# Patient Record
Sex: Male | Born: 1950 | Race: Black or African American | Hispanic: No | Marital: Single | Smoking: Heavy tobacco smoker
Health system: Southern US, Community
[De-identification: ages and names within clinical notes are randomized; demographics above are authoritative.]

## PROBLEM LIST (undated history)

## (undated) DIAGNOSIS — F209 Schizophrenia, unspecified: Secondary | ICD-10-CM

## (undated) DIAGNOSIS — R569 Unspecified convulsions: Secondary | ICD-10-CM

## (undated) DIAGNOSIS — E785 Hyperlipidemia, unspecified: Secondary | ICD-10-CM

## (undated) DIAGNOSIS — I10 Essential (primary) hypertension: Secondary | ICD-10-CM

## (undated) DIAGNOSIS — F419 Anxiety disorder, unspecified: Secondary | ICD-10-CM

---

## 2015-12-20 ENCOUNTER — Encounter (HOSPITAL_COMMUNITY): Payer: Self-pay | Admitting: Emergency Medicine

## 2015-12-20 ENCOUNTER — Emergency Department (HOSPITAL_COMMUNITY): Payer: Medicare Other

## 2015-12-20 ENCOUNTER — Inpatient Hospital Stay (HOSPITAL_COMMUNITY)
Admission: EM | Admit: 2015-12-20 | Discharge: 2016-01-03 | DRG: 163 | Disposition: A | Discharge status: Home / Self Care | Payer: Medicare Other | Attending: Internal Medicine | Admitting: Internal Medicine

## 2015-12-20 DIAGNOSIS — I10 Essential (primary) hypertension: Secondary | ICD-10-CM | POA: Diagnosis present

## 2015-12-20 DIAGNOSIS — Z9689 Presence of other specified functional implants: Secondary | ICD-10-CM

## 2015-12-20 DIAGNOSIS — J918 Pleural effusion in other conditions classified elsewhere: Secondary | ICD-10-CM | POA: Diagnosis present

## 2015-12-20 DIAGNOSIS — Z01818 Encounter for other preprocedural examination: Secondary | ICD-10-CM

## 2015-12-20 DIAGNOSIS — N183 Chronic kidney disease, stage 3 (moderate): Secondary | ICD-10-CM | POA: Diagnosis present

## 2015-12-20 DIAGNOSIS — I129 Hypertensive chronic kidney disease with stage 1 through stage 4 chronic kidney disease, or unspecified chronic kidney disease: Secondary | ICD-10-CM | POA: Diagnosis present

## 2015-12-20 DIAGNOSIS — F209 Schizophrenia, unspecified: Secondary | ICD-10-CM | POA: Diagnosis present

## 2015-12-20 DIAGNOSIS — J189 Pneumonia, unspecified organism: Secondary | ICD-10-CM | POA: Diagnosis not present

## 2015-12-20 DIAGNOSIS — F17210 Nicotine dependence, cigarettes, uncomplicated: Secondary | ICD-10-CM | POA: Diagnosis present

## 2015-12-20 DIAGNOSIS — J869 Pyothorax without fistula: Secondary | ICD-10-CM | POA: Diagnosis present

## 2015-12-20 DIAGNOSIS — Z7982 Long term (current) use of aspirin: Secondary | ICD-10-CM

## 2015-12-20 DIAGNOSIS — N179 Acute kidney failure, unspecified: Secondary | ICD-10-CM | POA: Insufficient documentation

## 2015-12-20 DIAGNOSIS — F419 Anxiety disorder, unspecified: Secondary | ICD-10-CM | POA: Diagnosis present

## 2015-12-20 DIAGNOSIS — Z09 Encounter for follow-up examination after completed treatment for conditions other than malignant neoplasm: Secondary | ICD-10-CM

## 2015-12-20 DIAGNOSIS — Z833 Family history of diabetes mellitus: Secondary | ICD-10-CM

## 2015-12-20 DIAGNOSIS — D638 Anemia in other chronic diseases classified elsewhere: Secondary | ICD-10-CM | POA: Diagnosis present

## 2015-12-20 DIAGNOSIS — J9811 Atelectasis: Secondary | ICD-10-CM | POA: Diagnosis present

## 2015-12-20 DIAGNOSIS — Z79899 Other long term (current) drug therapy: Secondary | ICD-10-CM

## 2015-12-20 DIAGNOSIS — E785 Hyperlipidemia, unspecified: Secondary | ICD-10-CM | POA: Diagnosis present

## 2015-12-20 DIAGNOSIS — J90 Pleural effusion, not elsewhere classified: Secondary | ICD-10-CM | POA: Insufficient documentation

## 2015-12-20 HISTORY — DX: Unspecified convulsions: R56.9

## 2015-12-20 HISTORY — DX: Schizophrenia, unspecified: F20.9

## 2015-12-20 HISTORY — DX: Essential (primary) hypertension: I10

## 2015-12-20 HISTORY — DX: Hyperlipidemia, unspecified: E78.5

## 2015-12-20 HISTORY — DX: Anxiety disorder, unspecified: F41.9

## 2015-12-20 LAB — CBC WITH DIFFERENTIAL/PLATELET
Basophils Absolute: 0 10*3/uL (ref 0.0–0.1)
Basophils Relative: 0 %
Eosinophils Absolute: 0 10*3/uL (ref 0.0–0.7)
Eosinophils Relative: 0 %
HCT: 38.8 % — ABNORMAL LOW (ref 39.0–52.0)
Hemoglobin: 12.7 g/dL — ABNORMAL LOW (ref 13.0–17.0)
Lymphocytes Relative: 9 %
Lymphs Abs: 1.6 10*3/uL (ref 0.7–4.0)
MCH: 29.2 pg (ref 26.0–34.0)
MCHC: 32.7 g/dL (ref 30.0–36.0)
MCV: 89.2 fL (ref 78.0–100.0)
Monocytes Absolute: 1.3 10*3/uL — ABNORMAL HIGH (ref 0.1–1.0)
Monocytes Relative: 7 %
Neutro Abs: 15.9 10*3/uL — ABNORMAL HIGH (ref 1.7–7.7)
Neutrophils Relative %: 84 %
Platelets: 277 10*3/uL (ref 150–400)
RBC: 4.35 MIL/uL (ref 4.22–5.81)
RDW: 12.9 % (ref 11.5–15.5)
WBC: 18.9 10*3/uL — ABNORMAL HIGH (ref 4.0–10.5)

## 2015-12-20 LAB — I-STAT CG4 LACTIC ACID, ED: Lactic Acid, Venous: 1.59 mmol/L (ref 0.5–2.0)

## 2015-12-20 MED ORDER — ACETAMINOPHEN 325 MG PO TABS
650.0000 mg | ORAL_TABLET | Freq: Once | ORAL | Status: AC | PRN
  Administered 2015-12-20: 650 mg via ORAL
  Filled 2015-12-20: qty 2

## 2015-12-20 MED ORDER — SODIUM CHLORIDE 0.9 % IV BOLUS (SEPSIS)
1000.0000 mL | Freq: Once | INTRAVENOUS | Status: AC
  Administered 2015-12-20: 1000 mL via INTRAVENOUS

## 2015-12-20 NOTE — ED Notes (Signed)
Pt reports pain on palpation of right upper quadrant. Denies any nausea, vomiting, or diarrhea.

## 2015-12-20 NOTE — ED Notes (Signed)
Per EMS pt presented from [facility] for RUQ pain. 18 ga IV established with appx 100ml bolus given by EMS. Unknown of time of pain beginning. EMS also advised pt taking night time medications. Pt states pain is aching intermittently.

## 2015-12-20 NOTE — ED Provider Notes (Signed)
CSN: [id number]     Arrival date & time 12/20/15  2232 History   First MD Initiated Contact with Patient 12/20/15 2250     Chief Complaint  Patient presents with  . Abdominal Pain  . Fever     (Consider location/radiation/quality/duration/timing/severity/associated sxs/prior Treatment) HPI   64yM with R lateral chest/RUQ pain. He is very pleasant and entertaining me with stories of him playing sports as a younger man. He grew up in [place], went to [employer] and played basketball for [employer] in the early 1970s. Unfortunately though he cannot tell me much in terms of HPI. Mild encephalopathy? Dementia? Has listed history of schizophrenia?  He has sharp pain in his R chest/RUQ. Not sure when exactly it started. It is worse with coughing and certain movement. Cough is nonproductive. Subjective fever. No n/v. No urinary complaints.   Past Medical History  Diagnosis Date  . Schizophrenia (HCC)   . Anxiety   . Seizures (HCC)   . Hypertension   . Dyslipidemia    History reviewed. No pertinent past surgical history. History reviewed. No pertinent family history. Social History  Substance Use Topics  . Smoking status: Heavy Tobacco Smoker -- 0.50 packs/day    Types: Cigars  . Smokeless tobacco: None  . Alcohol Use: No    Review of Systems  All systems reviewed and negative, other than as noted in HPI.   Allergies  Review of patient's allergies indicates no known allergies.  Home Medications   Prior to Admission medications   Medication Sig Start Date End Date Taking? Authorizing Provider  acetaminophen (TYLENOL) 500 MG tablet Take 500 mg by mouth every 8 (eight) hours as needed for mild pain, moderate pain or headache.   Yes Historical Provider, MD  amLODipine (NORVASC) 5 MG tablet Take 5 mg by mouth daily.   Yes Historical Provider, MD  aspirin 81 MG tablet Take 81 mg by mouth daily.   Yes Historical Provider, MD  benztropine (COGENTIN) 0.5 MG tablet Take 0.5 mg by mouth  2 (two) times daily.   Yes Historical Provider, MD  carbamazepine (TEGRETOL) 200 MG tablet Take 400 mg by mouth at bedtime.   Yes Historical Provider, MD  cetirizine (ZYRTEC) 10 MG tablet Take 10 mg by mouth at bedtime.   Yes Historical Provider, MD  cholecalciferol (VITAMIN D) 400 units TABS tablet Take 400 Units by mouth daily.   Yes Historical Provider, MD  docusate sodium (COLACE) 100 MG capsule Take 100 mg by mouth 2 (two) times daily.   Yes Historical Provider, MD  haloperidol (HALDOL) 20 MG tablet Take 20 mg by mouth at bedtime.   Yes Historical Provider, MD  hydrochlorothiazide (MICROZIDE) 12.5 MG capsule Take 12.5 mg by mouth daily.   Yes Historical Provider, MD  meloxicam (MOBIC) 7.5 MG tablet Take 7.5 mg by mouth daily.   Yes Historical Provider, MD  simvastatin (ZOCOR) 10 MG tablet Take 10 mg by mouth at bedtime.   Yes Historical Provider, MD  Skin Protectants, Misc. (EUCERIN) cream Apply 1 application topically 2 (two) times daily.   Yes Historical Provider, MD   BP 121/81 mmHg  Pulse 107  Temp(Src) 99.9 F (37.7 C) (Oral)  Resp 17  Ht 6' (1.829 m)  Wt 199 lb (90.266 kg)  BMI 26.98 kg/m2  SpO2 97% Physical Exam  Constitutional: He appears well-developed and well-nourished. No distress.  HENT:  Head: Normocephalic and atraumatic.  Eyes: Conjunctivae are normal. Right eye exhibits no discharge. Left eye exhibits  no discharge.  Neck: Neck supple.  Cardiovascular: Regular rhythm and normal heart sounds.  Exam reveals no gallop and no friction rub.   No murmur heard. tachycardic  Pulmonary/Chest: Breath sounds normal.  Mild tachypnea, but otherwise no respiratory distress. Rhonchi R mid and lower lobes.   Abdominal: Soft. He exhibits no distension. There is no tenderness.  No tenderness. No distension.   Genitourinary:  No cva tenderness  Musculoskeletal: He exhibits no edema or tenderness.  Lower extremities symmetric as compared to each other. No calf tenderness.  Negative Homan's. No palpable cords.   Neurological: He is alert.  Skin: Skin is warm and dry.  Nursing note and vitals reviewed.   ED Course  Procedures (including critical care time) Labs Review Labs Reviewed  CBC WITH DIFFERENTIAL/PLATELET - Abnormal; Notable for the following:    WBC 18.9 (*)    Hemoglobin 12.7 (*)    HCT 38.8 (*)    Neutro Abs 15.9 (*)    Monocytes Absolute 1.3 (*)    All other components within normal limits  COMPREHENSIVE METABOLIC PANEL - Abnormal; Notable for the following:    Glucose, Bld 128 (*)    Creatinine, Ser 1.46 (*)    Calcium 8.6 (*)    Albumin 3.2 (*)    ALT 12 (*)    GFR calc non Af Amer 49 (*)    GFR calc Af Amer 57 (*)    All other components within normal limits  CULTURE, BLOOD (ROUTINE X 2)  CULTURE, BLOOD (ROUTINE X 2)  URINE CULTURE  LIPASE, BLOOD  URINALYSIS, ROUTINE W REFLEX MICROSCOPIC (NOT AT [facility])  I-STAT CG4 LACTIC ACID, ED    Imaging Review Dg Chest 2 View  12/20/2015  CLINICAL DATA:  Fever and right upper quadrant pain. EXAM: CHEST  2 VIEW COMPARISON:  None. FINDINGS: Airspace infiltration in the right lung base with small right pleural effusion. This may indicate pneumonia. Followup PA and lateral chest X-ray is recommended in 3-4 weeks following trial of antibiotic therapy to ensure resolution and exclude underlying malignancy. Left lung is clear. Normal heart size and pulmonary vascularity. No pneumothorax. Degenerative changes in the thoracic spine and shoulders. IMPRESSION: Right lung base infiltration with small right pleural effusion likely representing pneumonia. Electronically Signed   By: Burman NievesWilliam  Stevens M.D.   On: 12/20/2015 23:31   I have personally reviewed and evaluated these images and lab results as part of my medical decision-making.   EKG Interpretation None      MDM   Final diagnoses:  CAP (community acquired pneumonia)    64yM with R sided CP/RUQ pain and fever. CXR with RLL pneumonia.  Explains fever and pain. Abdominal exam is benign. LFTs normal. Not consistent with cholecystitis or other intraabdominal process. Abx for CAP. UA still pending, but rocephin should cover regardless. He actually looks fairly well sitting in bed but has tahchypnea in mid 20s. BP fine. Lactic acid is normal. Mild renal impairment, but no baseline labs for comparison.   Raeford RazorStephen Keyna Blizard, MD 12/23/15 [id number]

## 2015-12-20 NOTE — ED Notes (Signed)
MD at bedside. 

## 2015-12-20 NOTE — ED Notes (Signed)
Bed: [id number] Expected date:  Expected time:  Means of arrival:  Comments: EMS 64yo M RUQ pain;

## 2015-12-21 ENCOUNTER — Encounter (HOSPITAL_COMMUNITY): Payer: Self-pay | Admitting: Internal Medicine

## 2015-12-21 ENCOUNTER — Inpatient Hospital Stay (HOSPITAL_COMMUNITY): Payer: Medicare Other

## 2015-12-21 DIAGNOSIS — J869 Pyothorax without fistula: Secondary | ICD-10-CM | POA: Diagnosis present

## 2015-12-21 DIAGNOSIS — Z7982 Long term (current) use of aspirin: Secondary | ICD-10-CM | POA: Diagnosis not present

## 2015-12-21 DIAGNOSIS — J189 Pneumonia, unspecified organism: Secondary | ICD-10-CM | POA: Diagnosis present

## 2015-12-21 DIAGNOSIS — Z79899 Other long term (current) drug therapy: Secondary | ICD-10-CM | POA: Diagnosis not present

## 2015-12-21 DIAGNOSIS — J948 Other specified pleural conditions: Secondary | ICD-10-CM | POA: Diagnosis not present

## 2015-12-21 DIAGNOSIS — I10 Essential (primary) hypertension: Secondary | ICD-10-CM | POA: Diagnosis not present

## 2015-12-21 DIAGNOSIS — N179 Acute kidney failure, unspecified: Secondary | ICD-10-CM | POA: Diagnosis not present

## 2015-12-21 DIAGNOSIS — E785 Hyperlipidemia, unspecified: Secondary | ICD-10-CM | POA: Diagnosis present

## 2015-12-21 DIAGNOSIS — F17210 Nicotine dependence, cigarettes, uncomplicated: Secondary | ICD-10-CM | POA: Diagnosis present

## 2015-12-21 DIAGNOSIS — F209 Schizophrenia, unspecified: Secondary | ICD-10-CM | POA: Diagnosis present

## 2015-12-21 DIAGNOSIS — I129 Hypertensive chronic kidney disease with stage 1 through stage 4 chronic kidney disease, or unspecified chronic kidney disease: Secondary | ICD-10-CM | POA: Diagnosis present

## 2015-12-21 DIAGNOSIS — J90 Pleural effusion, not elsewhere classified: Secondary | ICD-10-CM | POA: Diagnosis not present

## 2015-12-21 DIAGNOSIS — J918 Pleural effusion in other conditions classified elsewhere: Secondary | ICD-10-CM | POA: Diagnosis present

## 2015-12-21 DIAGNOSIS — Z833 Family history of diabetes mellitus: Secondary | ICD-10-CM | POA: Diagnosis not present

## 2015-12-21 DIAGNOSIS — D638 Anemia in other chronic diseases classified elsewhere: Secondary | ICD-10-CM | POA: Diagnosis present

## 2015-12-21 DIAGNOSIS — N183 Chronic kidney disease, stage 3 (moderate): Secondary | ICD-10-CM | POA: Diagnosis present

## 2015-12-21 DIAGNOSIS — J9811 Atelectasis: Secondary | ICD-10-CM | POA: Diagnosis present

## 2015-12-21 DIAGNOSIS — F419 Anxiety disorder, unspecified: Secondary | ICD-10-CM | POA: Diagnosis present

## 2015-12-21 LAB — URINE MICROSCOPIC-ADD ON

## 2015-12-21 LAB — COMPREHENSIVE METABOLIC PANEL
ALK PHOS: 92 U/L (ref 38–126)
ALT: 12 U/L — AB (ref 17–63)
ALT: 12 U/L — ABNORMAL LOW (ref 17–63)
ANION GAP: 10 (ref 5–15)
AST: 17 U/L (ref 15–41)
AST: 18 U/L (ref 15–41)
Albumin: 3 g/dL — ABNORMAL LOW (ref 3.5–5.0)
Albumin: 3.2 g/dL — ABNORMAL LOW (ref 3.5–5.0)
Alkaline Phosphatase: 102 U/L (ref 38–126)
Anion gap: 11 (ref 5–15)
BILIRUBIN TOTAL: 0.7 mg/dL (ref 0.3–1.2)
BUN: 20 mg/dL (ref 6–20)
BUN: 20 mg/dL (ref 6–20)
CALCIUM: 8.6 mg/dL — AB (ref 8.9–10.3)
CO2: 24 mmol/L (ref 22–32)
CO2: 27 mmol/L (ref 22–32)
CREATININE: 1.42 mg/dL — AB (ref 0.61–1.24)
Calcium: 8.6 mg/dL — ABNORMAL LOW (ref 8.9–10.3)
Chloride: 101 mmol/L (ref 101–111)
Chloride: 102 mmol/L (ref 101–111)
Creatinine, Ser: 1.46 mg/dL — ABNORMAL HIGH (ref 0.61–1.24)
GFR calc Af Amer: 57 mL/min — ABNORMAL LOW (ref >60)
GFR calc non Af Amer: 49 mL/min — ABNORMAL LOW (ref >60)
GFR, EST AFRICAN AMERICAN: 59 mL/min — AB (ref >60)
GFR, EST NON AFRICAN AMERICAN: 51 mL/min — AB (ref >60)
Glucose, Bld: 112 mg/dL — ABNORMAL HIGH (ref 65–99)
Glucose, Bld: 128 mg/dL — ABNORMAL HIGH (ref 65–99)
Potassium: 3.8 mmol/L (ref 3.5–5.1)
Potassium: 3.8 mmol/L (ref 3.5–5.1)
Sodium: 137 mmol/L (ref 135–145)
Sodium: 138 mmol/L (ref 135–145)
TOTAL PROTEIN: 6.7 g/dL (ref 6.5–8.1)
Total Bilirubin: 0.9 mg/dL (ref 0.3–1.2)
Total Protein: 7.1 g/dL (ref 6.5–8.1)

## 2015-12-21 LAB — INFLUENZA PANEL BY PCR (TYPE A & B)
H1N1 flu by pcr: NOT DETECTED
Influenza A By PCR: NEGATIVE
Influenza B By PCR: NEGATIVE

## 2015-12-21 LAB — URINALYSIS, ROUTINE W REFLEX MICROSCOPIC
Bilirubin Urine: NEGATIVE
Glucose, UA: NEGATIVE mg/dL
Ketones, ur: NEGATIVE mg/dL
Leukocytes, UA: NEGATIVE
Nitrite: NEGATIVE
Protein, ur: 30 mg/dL — AB
Specific Gravity, Urine: 1.013 (ref 1.005–1.030)
pH: 6 (ref 5.0–8.0)

## 2015-12-21 LAB — CBC WITH DIFFERENTIAL/PLATELET
BASOS PCT: 0 %
Basophils Absolute: 0 10*3/uL (ref 0.0–0.1)
Eosinophils Absolute: 0.1 10*3/uL (ref 0.0–0.7)
Eosinophils Relative: 0 %
HEMATOCRIT: 37.8 % — AB (ref 39.0–52.0)
Hemoglobin: 12.4 g/dL — ABNORMAL LOW (ref 13.0–17.0)
Lymphocytes Relative: 10 %
Lymphs Abs: 1.8 10*3/uL (ref 0.7–4.0)
MCH: 29.3 pg (ref 26.0–34.0)
MCHC: 32.8 g/dL (ref 30.0–36.0)
MCV: 89.4 fL (ref 78.0–100.0)
MONOS PCT: 7 %
Monocytes Absolute: 1.2 10*3/uL — ABNORMAL HIGH (ref 0.1–1.0)
NEUTROS ABS: 15.7 10*3/uL — AB (ref 1.7–7.7)
NEUTROS PCT: 83 %
Platelets: 264 10*3/uL (ref 150–400)
RBC: 4.23 MIL/uL (ref 4.22–5.81)
RDW: 12.8 % (ref 11.5–15.5)
WBC: 18.8 10*3/uL — AB (ref 4.0–10.5)

## 2015-12-21 LAB — HIV ANTIBODY (ROUTINE TESTING W REFLEX): HIV Screen 4th Generation wRfx: NONREACTIVE

## 2015-12-21 LAB — STREP PNEUMONIAE URINARY ANTIGEN: Strep Pneumo Urinary Antigen: NEGATIVE

## 2015-12-21 LAB — LIPASE, BLOOD: Lipase: 35 U/L (ref 11–51)

## 2015-12-21 MED ORDER — HYDRALAZINE HCL 20 MG/ML IJ SOLN: 10.0000 mg | INTRAMUSCULAR | Status: DC | PRN

## 2015-12-21 MED ORDER — GUAIFENESIN-DM 100-10 MG/5ML PO SYRP
5.0000 mL | ORAL_SOLUTION | ORAL | Status: DC | PRN
  Administered 2015-12-21 (×2): 5 mL via ORAL
  Filled 2015-12-21 (×3): qty 10

## 2015-12-21 MED ORDER — CARBAMAZEPINE 200 MG PO TABS
400.0000 mg | ORAL_TABLET | Freq: Every day | ORAL | Status: DC
  Administered 2015-12-21 – 2016-01-02 (×12): 400 mg via ORAL
  Filled 2015-12-21 (×15): qty 2

## 2015-12-21 MED ORDER — HALOPERIDOL 2 MG PO TABS
20.0000 mg | ORAL_TABLET | Freq: Every day | ORAL | Status: DC
  Administered 2015-12-21 – 2015-12-28 (×7): 20 mg via ORAL
  Filled 2015-12-21 (×10): qty 4

## 2015-12-21 MED ORDER — DOCUSATE SODIUM 100 MG PO CAPS
100.0000 mg | ORAL_CAPSULE | Freq: Two times a day (BID) | ORAL | Status: DC
  Administered 2015-12-21 – 2015-12-28 (×15): 100 mg via ORAL
  Filled 2015-12-21 (×15): qty 1

## 2015-12-21 MED ORDER — ONDANSETRON HCL 4 MG PO TABS: 4.0000 mg | ORAL_TABLET | Freq: Four times a day (QID) | ORAL | Status: DC | PRN

## 2015-12-21 MED ORDER — ENOXAPARIN SODIUM 40 MG/0.4ML ~~LOC~~ SOLN
40.0000 mg | SUBCUTANEOUS | Status: DC
  Administered 2015-12-21 – 2015-12-27 (×5): 40 mg via SUBCUTANEOUS
  Filled 2015-12-21 (×7): qty 0.4

## 2015-12-21 MED ORDER — ACETAMINOPHEN 325 MG PO TABS
650.0000 mg | ORAL_TABLET | Freq: Four times a day (QID) | ORAL | Status: DC | PRN
  Administered 2015-12-21 – 2015-12-26 (×2): 650 mg via ORAL
  Filled 2015-12-21 (×2): qty 2

## 2015-12-21 MED ORDER — GUAIFENESIN 100 MG/5ML PO SOLN: 5.0000 mL | ORAL | Status: DC | PRN

## 2015-12-21 MED ORDER — SIMVASTATIN 20 MG PO TABS
10.0000 mg | ORAL_TABLET | Freq: Every day | ORAL | Status: DC
Start: 2015-12-21 — End: 2016-01-03
  Administered 2015-12-21 – 2016-01-02 (×13): 10 mg via ORAL
  Filled 2015-12-21 (×13): qty 1

## 2015-12-21 MED ORDER — AMLODIPINE BESYLATE 5 MG PO TABS
5.0000 mg | ORAL_TABLET | Freq: Every day | ORAL | Status: DC
  Administered 2015-12-21 – 2016-01-03 (×14): 5 mg via ORAL
  Filled 2015-12-21 (×14): qty 1

## 2015-12-21 MED ORDER — DEXTROSE 5 % IV SOLN
1.0000 g | INTRAVENOUS | Status: AC
  Administered 2015-12-21 – 2015-12-26 (×6): 1 g via INTRAVENOUS
  Filled 2015-12-21 (×6): qty 10

## 2015-12-21 MED ORDER — LORATADINE 10 MG PO TABS
10.0000 mg | ORAL_TABLET | Freq: Every day | ORAL | Status: DC
  Administered 2015-12-21 – 2016-01-03 (×14): 10 mg via ORAL
  Filled 2015-12-21 (×14): qty 1

## 2015-12-21 MED ORDER — DEXTROSE 5 % IV SOLN
500.0000 mg | Freq: Once | INTRAVENOUS | Status: AC
  Administered 2015-12-21: 500 mg via INTRAVENOUS
  Filled 2015-12-21: qty 500

## 2015-12-21 MED ORDER — DEXTROSE 5 % IV SOLN
1.0000 g | Freq: Once | INTRAVENOUS | Status: AC
  Administered 2015-12-21: 1 g via INTRAVENOUS
  Filled 2015-12-21: qty 10

## 2015-12-21 MED ORDER — ACETAMINOPHEN 650 MG RE SUPP: 650.0000 mg | Freq: Four times a day (QID) | RECTAL | Status: DC | PRN

## 2015-12-21 MED ORDER — HYDROCODONE-ACETAMINOPHEN 5-325 MG PO TABS
1.0000 | ORAL_TABLET | Freq: Once | ORAL | Status: AC
  Administered 2015-12-21: 1 via ORAL
  Filled 2015-12-21: qty 1

## 2015-12-21 MED ORDER — DEXTROSE 5 % IV SOLN
500.0000 mg | INTRAVENOUS | Status: DC
  Administered 2015-12-21 – 2015-12-23 (×3): 500 mg via INTRAVENOUS
  Filled 2015-12-21 (×3): qty 500

## 2015-12-21 MED ORDER — SODIUM CHLORIDE 0.9 % IV SOLN
INTRAVENOUS | Status: AC
  Administered 2015-12-21 – 2015-12-22 (×2): via INTRAVENOUS

## 2015-12-21 MED ORDER — ONDANSETRON HCL 4 MG/2ML IJ SOLN: 4.0000 mg | Freq: Four times a day (QID) | INTRAMUSCULAR | Status: DC | PRN

## 2015-12-21 MED ORDER — MORPHINE SULFATE (PF) 2 MG/ML IV SOLN
0.5000 mg | INTRAVENOUS | Status: DC | PRN
  Administered 2015-12-21 – 2015-12-26 (×9): 0.5 mg via INTRAVENOUS
  Filled 2015-12-21 (×9): qty 1

## 2015-12-21 MED ORDER — BENZTROPINE MESYLATE 0.5 MG PO TABS
0.5000 mg | ORAL_TABLET | Freq: Two times a day (BID) | ORAL | Status: DC
  Administered 2015-12-21 – 2016-01-03 (×27): 0.5 mg via ORAL
  Filled 2015-12-21 (×34): qty 1

## 2015-12-21 MED ORDER — ASPIRIN EC 81 MG PO TBEC
81.0000 mg | DELAYED_RELEASE_TABLET | Freq: Every day | ORAL | Status: DC
  Administered 2015-12-21 – 2016-01-03 (×13): 81 mg via ORAL
  Filled 2015-12-21 (×14): qty 1

## 2015-12-21 NOTE — H&P (Signed)
[facility] History and Physical  Ian Carter ZOX:[id number] DOB: [birth year] DOA: 12/20/2015  Referring physician: ER physician. PCP: No primary care provider on file.  Specialists: None.  Chief Complaint: Right-sided chest pain.  HPI: Ian Carter is a 65 y.o. male with history of hypertension, hyperlipidemia, schizophrenia presents to the ER because of persistent right-sided chest pain or last 4-5 days. Patient states he has been having constant pleuritic type of right-sided chest pain. Has been having some cough. Denies any recent sick contacts or travel. Patient is a poor historian. Patient is found to be febrile with temperatures around 102F in the ER and chest x-ray shows a right-sided infiltrate with effusion. Patient has been admitted for pneumonia.   Review of Systems: As presented in the history of presenting illness, rest negative.  Past Medical History  Diagnosis Date  . Schizophrenia (HCC)   . Anxiety   . Seizures (HCC)   . Hypertension   . Dyslipidemia    History reviewed. No pertinent past surgical history. Social History:  reports that he has been smoking Cigars.  He does not have any smokeless tobacco history on file. He reports that he does not drink alcohol or use illicit drugs. Where does patient live Home. Can patient participate in ADLs? Yes.  No Known Allergies  Family History:  Family History  Problem Relation Age of Onset  . Diabetes Mellitus II Brother       Prior to Admission medications   Medication Sig Start Date End Date Taking? Authorizing Provider  acetaminophen (TYLENOL) 500 MG tablet Take 500 mg by mouth every 8 (eight) hours as needed for mild pain, moderate pain or headache.   Yes Historical Provider, MD  amLODipine (NORVASC) 5 MG tablet Take 5 mg by mouth daily.   Yes Historical Provider, MD  aspirin 81 MG tablet Take 81 mg by mouth daily.   Yes Historical Provider, MD  benztropine (COGENTIN) 0.5 MG tablet Take 0.5 mg by  mouth 2 (two) times daily.   Yes Historical Provider, MD  carbamazepine (TEGRETOL) 200 MG tablet Take 400 mg by mouth at bedtime.   Yes Historical Provider, MD  cetirizine (ZYRTEC) 10 MG tablet Take 10 mg by mouth at bedtime.   Yes Historical Provider, MD  cholecalciferol (VITAMIN D) 400 units TABS tablet Take 400 Units by mouth daily.   Yes Historical Provider, MD  docusate sodium (COLACE) 100 MG capsule Take 100 mg by mouth 2 (two) times daily.   Yes Historical Provider, MD  haloperidol (HALDOL) 20 MG tablet Take 20 mg by mouth at bedtime.   Yes Historical Provider, MD  hydrochlorothiazide (MICROZIDE) 12.5 MG capsule Take 12.5 mg by mouth daily.   Yes Historical Provider, MD  meloxicam (MOBIC) 7.5 MG tablet Take 7.5 mg by mouth daily.   Yes Historical Provider, MD  simvastatin (ZOCOR) 10 MG tablet Take 10 mg by mouth at bedtime.   Yes Historical Provider, MD  Skin Protectants, Misc. (EUCERIN) cream Apply 1 application topically 2 (two) times daily.   Yes Historical Provider, MD    Physical Exam: Filed Vitals:   12/20/15 2340 12/21/15 0020 12/21/15 0103 12/21/15 0140  BP: 121/81 104/73 113/62 116/70  Pulse: 107 107 105 99  Temp: 99.9 F (37.7 C)  98.7 F (37.1 C) 100.3 F (37.9 C)  TempSrc: Oral  Oral Oral  Resp: 17 24 18 20   Height:    6' (1.829 m)  Weight:    192 lb 11.2 oz (87.408 kg)  SpO2: 97%  95% 97% 97%     General:  Moderately built and nourished.  Eyes: Anicteric no pallor.  ENT: No discharge from the ears eyes nose and mouth.  Neck: No mass felt.  Cardiovascular: S1 and S2 heard.  Respiratory: No rhonchi or crepitations.  Abdomen: Soft nontender bowel sounds present.  Skin: No rash.  Musculoskeletal: No edema.  Psychiatric: Appears normal.  Neurologic: Alert awake oriented to time place and person. Moves all extremities.  Labs on Admission:  Basic Metabolic Panel:  Recent Labs Lab 12/20/15 2331  NA 137  K 3.8  CL 102  CO2 24  GLUCOSE 128*  BUN  20  CREATININE 1.46*  CALCIUM 8.6*   Liver Function Tests:  Recent Labs Lab 12/20/15 2331  AST 18  ALT 12*  ALKPHOS 102  BILITOT 0.9  PROT 7.1  ALBUMIN 3.2*    Recent Labs Lab 12/20/15 2331  LIPASE 35   No results for input(s): AMMONIA in the last 168 hours. CBC:  Recent Labs Lab 12/20/15 2256  WBC 18.9*  NEUTROABS 15.9*  HGB 12.7*  HCT 38.8*  MCV 89.2  PLT 277   Cardiac Enzymes: No results for input(s): CKTOTAL, CKMB, CKMBINDEX, TROPONINI in the last 168 hours.  BNP (last 3 results) No results for input(s): BNP in the last 8760 hours.  ProBNP (last 3 results) No results for input(s): PROBNP in the last 8760 hours.  CBG: No results for input(s): GLUCAP in the last 168 hours.  Radiological Exams on Admission: Dg Chest 2 View  12/20/2015  CLINICAL DATA:  Fever and right upper quadrant pain. EXAM: CHEST  2 VIEW COMPARISON:  None. FINDINGS: Airspace infiltration in the right lung base with small right pleural effusion. This may indicate pneumonia. Followup PA and lateral chest X-ray is recommended in 3-4 weeks following trial of antibiotic therapy to ensure resolution and exclude underlying malignancy. Left lung is clear. Normal heart size and pulmonary vascularity. No pneumothorax. Degenerative changes in the thoracic spine and shoulders. IMPRESSION: Right lung base infiltration with small right pleural effusion likely representing pneumonia. Electronically Signed   By: Burman Nieves M.D.   On: 12/20/2015 23:31     Assessment/Plan Principal Problem:   CAP (community acquired pneumonia) Active Problems:   HTN (hypertension)   HLD (hyperlipidemia)   1. Community acquired pneumonia - patient has been placed on ceftriaxone and Zithromax. Check influenza PCR strep antigen and Legionella antigen. Follow blood cultures. Since patient has pleural effusion will check CT chest to rule out any loculation.  2. Hypertension - hold off hydrochlorothiazide since patient  is receiving IV fluids. Continue amlodipine and when necessary IV hydralazine. 3. Hyperlipidemia on statins. 4. Schizophrenia continue present medications. 5. Patient is on carbamazepine. Not sure why patient is on that. May need to discuss with family name. 6. Renal failure - baseline creatinine not known. Follow metabolic panel closely.   DVT Prophylaxis Lovenox.  Code Status: Full code.  Family Communication: Discussed with patient.  Disposition Plan: Admit to inpatient.    KAKRAKANDY,ARSHAD N. [facility] Pager [phone].  If 7PM-7AM, please contact night-coverage [email or web address] Password [email or web address] 12/21/2015, 3:01 AM

## 2015-12-21 NOTE — Progress Notes (Signed)
PROGRESS NOTE    Ian Carter  ZOX:[id number]  DOB: Feb 09, 1951  DOA: 12/20/2015 PCP: No primary care provider on file. Outpatient Specialists:   Hospital course: 65 year old male, supposedly resident of a group home, history of schizophrenia, HTN, HLD, presented to ED with right-sided pleuritic type of chest pain, cough, some dyspnea. In the ED, febrile 102F and chest x-ray showed right-sided infiltrate with effusion. Admitted for community-acquired pneumonia.   Assessment & Plan:   Community-acquired pneumonia - Influenza panel PCR negative. Urine Legionella antigen: Pending. Urinary streptococcal antigen: Negative. HIV antibodies: Nonreactive. - Treating empirically with IV Rocephin and azithromycin. - CT chest 3/22: Right lower lobe consolidation compatible with pneumonia, superimposed moderate-sized right pleural effusion. - will need follow-up chest x-ray in 3-4 weeks to ensure resolution of pneumonia findings.  Essential hypertension - Controlled on amlodipine. HCTZ held temporarily.  Hyperlipidemia - Continue statins.  Schizophrenia - Stable without complaints. Continue home medications-haloperidol and benztropine. Patient apparently is on carbamazepine at home for unclear indications.  Possible stage III chronic kidney disease - Baseline creatinine unknown. Creatinine stable in the 1.4 range. Follow BMP and am.    DVT prophylaxis: Lovenox Code Status: Full Family Communication: None at bedside Disposition Plan: DC home when medically stable, possibly in 48-72 hours.   Consultants:  None  Procedures:  None  Antimicrobials:  Azithromycin 3/21 >  Rocephin 3/21 >   Subjective: Feels better. Decreased cough. No chest pain. Some intermittent dyspnea. Denies suicidal or homicidal ideations or auditory or visual hallucinations.  Objective: Filed Vitals:   12/21/15 0103 12/21/15 0140 12/21/15 0648 12/21/15 1025  BP: 113/62 116/70 123/68 113/70    Pulse: 105 99 101   Temp: 98.7 F (37.1 C) 100.3 F (37.9 C) 99.2 F (37.3 C)   TempSrc: Oral Oral Oral   Resp: 18 20 20    Height:  6' (1.829 m)    Weight:  87.408 kg (192 lb 11.2 oz)    SpO2: 97% 97% 97%     Intake/Output Summary (Last 24 hours) at 12/21/15 1521 Last data filed at 12/21/15 1459  Gross per 24 hour  Intake   1465 ml  Output   1825 ml  Net   -360 ml   Filed Weights   12/20/15 2243 12/21/15 0140  Weight: 90.266 kg (199 lb) 87.408 kg (192 lb 11.2 oz)    Exam:  General exam: Pleasant middle-aged male lying comfortably propped up in bed. Respiratory system: Decreased breath sounds in right base. Rest of lung fields clear to auscultation. No pleural rub heard. No increased work of breathing. Cardiovascular system: S1 & S2 heard, RRR. No JVD, murmurs, gallops, clicks or pedal edema. Gastrointestinal system: Abdomen is nondistended, soft and nontender. Normal bowel sounds heard. Central nervous system: Alert and oriented. No focal neurological deficits. Extremities: Symmetric 5 x 5 power.   Data Reviewed: Basic Metabolic Panel:  Recent Labs Lab 12/20/15 2331 12/21/15 0342  NA 137 138  K 3.8 3.8  CL 102 101  CO2 24 27  GLUCOSE 128* 112*  BUN 20 20  CREATININE 1.46* 1.42*  CALCIUM 8.6* 8.6*   Liver Function Tests:  Recent Labs Lab 12/20/15 2331 12/21/15 0342  AST 18 17  ALT 12* 12*  ALKPHOS 102 92  BILITOT 0.9 0.7  PROT 7.1 6.7  ALBUMIN 3.2* 3.0*    Recent Labs Lab 12/20/15 2331  LIPASE 35   No results for input(s): AMMONIA in the last 168 hours. CBC:  Recent Labs Lab 12/20/15 2256 12/21/15  0342  WBC 18.9* 18.8*  NEUTROABS 15.9* 15.7*  HGB 12.7* 12.4*  HCT 38.8* 37.8*  MCV 89.2 89.4  PLT 277 264   Cardiac Enzymes: No results for input(s): CKTOTAL, CKMB, CKMBINDEX, TROPONINI in the last 168 hours. BNP (last 3 results) No results for input(s): PROBNP in the last 8760 hours. CBG: No results for input(s): GLUCAP in the last  168 hours.  No results found for this or any previous visit (from the past 240 hour(s)).       Studies: Dg Chest 2 View  12/20/2015  CLINICAL DATA:  Fever and right upper quadrant pain. EXAM: CHEST  2 VIEW COMPARISON:  None. FINDINGS: Airspace infiltration in the right lung base with small right pleural effusion. This may indicate pneumonia. Followup PA and lateral chest X-ray is recommended in 3-4 weeks following trial of antibiotic therapy to ensure resolution and exclude underlying malignancy. Left lung is clear. Normal heart size and pulmonary vascularity. No pneumothorax. Degenerative changes in the thoracic spine and shoulders. IMPRESSION: Right lung base infiltration with small right pleural effusion likely representing pneumonia. Electronically Signed   By: Burman Nieves M.D.   On: 12/20/2015 23:31   Ct Chest Wo Contrast  12/21/2015  CLINICAL DATA:  65 year old male with chest pain and shortness of breath for 1 month. Initial encounter. EXAM: CT CHEST WITHOUT CONTRAST TECHNIQUE: Multidetector CT imaging of the chest was performed following the standard protocol without IV contrast. COMPARISON:  Chest radiographs 12/20/2015. FINDINGS: Moderate layering right pleural effusion, with a component of pleural fluid tracking into the major fissure. Small volume dependent retained secretions or debris in the trachea on series 5, image 12. Otherwise Major airways are patent. Consolidation in the right lower lobe adjacent to the pleural fluid. Atelectasis or less likely early consolidation in the inferior right middle lobe. Trace if any left pleural fluid. Linear atelectasis or scarring in the left lower lobe. No pericardial effusion. Suggestion of mild right hilar lymphadenopathy (series 2, image 34). Otherwise no thoracic lymphadenopathy. Negative noncontrast thoracic inlet. Mild calcified coronary artery atherosclerosis (series 2, image 34). Negative noncontrast visible spleen, pancreas, adrenal  glands, kidneys, and bowel in the upper abdomen. Negative noncontrast liver; circumscribed 12 mm low-density area in the left lobe appears benign such as cyst. No acute osseous abnormality identified. IMPRESSION: 1. Right lower lobe consolidation compatible with pneumonia. Superimposed moderate size right pleural effusion, mostly layering but also tracking into the pleural fissures. Inferior right middle lobe opacity favored to be atelectasis. 2. Suggestion of reactive type right hilar lymphadenopathy. Small volume layering debris or secretions in the trachea. 3. No other acute findings in the noncontrast chest. Mild calcified coronary artery atherosclerosis. Followup PA and lateral chest X-ray is recommended in 3-4 weeks following trial of antibiotic therapy to ensure resolution and exclude underlying malignancy. Electronically Signed   By: Odessa Fleming M.D.   On: 12/21/2015 08:34        Scheduled Meds: . amLODipine  5 mg Oral Daily  . aspirin EC  81 mg Oral Daily  . azithromycin  500 mg Intravenous Q24H  . benztropine  0.5 mg Oral BID  . carbamazepine  400 mg Oral QHS  . cefTRIAXone (ROCEPHIN)  IV  1 g Intravenous Q24H  . docusate sodium  100 mg Oral BID  . enoxaparin (LOVENOX) injection  40 mg Subcutaneous Q24H  . haloperidol  20 mg Oral QHS  . loratadine  10 mg Oral Daily  . simvastatin  10 mg Oral QHS  Continuous Infusions: . sodium chloride 100 mL/hr at 12/21/15 0345    Principal Problem:   CAP (community acquired pneumonia) Active Problems:   HTN (hypertension)   HLD (hyperlipidemia)    Time spent: 25 minutes.    Marcellus Scott, MD, FACP, FHM. [facility] Pager [phone]  If 7PM-7AM, please contact night-coverage [email or web address] Password [email or web address] 12/21/2015, 3:21 PM    LOS: 0 days

## 2015-12-22 ENCOUNTER — Inpatient Hospital Stay (HOSPITAL_COMMUNITY): Payer: Medicare Other

## 2015-12-22 DIAGNOSIS — J948 Other specified pleural conditions: Secondary | ICD-10-CM

## 2015-12-22 LAB — BASIC METABOLIC PANEL
ANION GAP: 7 (ref 5–15)
BUN: 20 mg/dL (ref 6–20)
CALCIUM: 8.4 mg/dL — AB (ref 8.9–10.3)
CO2: 28 mmol/L (ref 22–32)
Chloride: 104 mmol/L (ref 101–111)
Creatinine, Ser: 1.64 mg/dL — ABNORMAL HIGH (ref 0.61–1.24)
GFR, EST AFRICAN AMERICAN: 50 mL/min — AB (ref >60)
GFR, EST NON AFRICAN AMERICAN: 43 mL/min — AB (ref >60)
Glucose, Bld: 119 mg/dL — ABNORMAL HIGH (ref 65–99)
Potassium: 4 mmol/L (ref 3.5–5.1)
SODIUM: 139 mmol/L (ref 135–145)

## 2015-12-22 LAB — CBC
HEMATOCRIT: 34.8 % — AB (ref 39.0–52.0)
Hemoglobin: 12.1 g/dL — ABNORMAL LOW (ref 13.0–17.0)
MCH: 29.7 pg (ref 26.0–34.0)
MCHC: 34.8 g/dL (ref 30.0–36.0)
MCV: 85.5 fL (ref 78.0–100.0)
Platelets: 242 10*3/uL (ref 150–400)
RBC: 4.07 MIL/uL — ABNORMAL LOW (ref 4.22–5.81)
RDW: 12.5 % (ref 11.5–15.5)
WBC: 27 10*3/uL — AB (ref 4.0–10.5)

## 2015-12-22 LAB — URINE CULTURE: Culture: NO GROWTH

## 2015-12-22 LAB — LEGIONELLA PNEUMOPHILA SEROGP 1 UR AG: L. pneumophila Serogp 1 Ur Ag: NEGATIVE

## 2015-12-22 MED ORDER — SODIUM CHLORIDE 0.9 % IV SOLN
INTRAVENOUS | Status: AC
  Administered 2015-12-22 (×2): via INTRAVENOUS

## 2015-12-22 NOTE — Progress Notes (Addendum)
PROGRESS NOTE    Ian Carter  ZOX:[id number]  DOB: January 24, 1951  DOA: 12/20/2015 PCP: No primary care provider on file. Outpatient Specialists:   Hospital course: 65 year old male, supposedly resident of a group home, history of schizophrenia, HTN, HLD, presented to ED with right-sided pleuritic type of chest pain, cough, some dyspnea. In the ED, febrile 102F and chest x-ray showed right-sided infiltrate with effusion. CT chest confirmed RLL PNA & moderate R sided pleural effusion. Admitted for community-acquired pneumonia.   Assessment & Plan:   RLL Community-acquired pneumonia with moderate pleural effusion (possibly parapneumonic) - Influenza panel PCR negative. Urine Legionella antigen: Pending. Urinary streptococcal antigen: Negative. HIV antibody: Nonreactive. - Treating empirically with IV Rocephin and azithromycin. - CT chest 3/22: Right lower lobe consolidation compatible with pneumonia, superimposed moderate-sized right pleural effusion. - will need follow-up chest x-ray in 3-4 weeks to ensure resolution of pneumonia findings. -  Blood cultures 2: negative to date. Urine culture times one: Negative. -  Patient did spike fever of  102.6 overnight 3/22. If has recurrent high fevers, consider broadening antibiotics and or thoracentesis  Essential hypertension - Controlled on amlodipine. HCTZ held temporarily.  Hyperlipidemia - Continue statins.  Schizophrenia - Stable without complaints. Continue home medications-haloperidol and benztropine. Patient apparently is on carbamazepine at home for unclear indications.  Possible stage III chronic kidney disease - Baseline creatinine unknown. Creatinine stable in the 1.4 range. Creatinine up 1.64- brief IVF and follow.  - Do not know baseline creatinine to say that he has acute decline. -  Renal ultrasound without hydronephrosis.   Leukocytosis -  Most likely secondary to pneumonia. No diarrhea reported. Follow  CBCs    DVT prophylaxis: Lovenox Code Status: Full Family Communication: None at bedside Disposition Plan: DC home when medically stable, possibly in 48-72 hours.   Consultants:  None  Procedures:  None  Antimicrobials:  Azithromycin 3/21 >  Rocephin 3/21 >   Subjective: Feels better. Decreased cough. R sided pleuritic chest pain - only on coughing.Denies dyspnea. Denies suicidal or homicidal ideations or auditory or visual hallucinations.  Objective: Filed Vitals:   12/21/15 2322 12/22/15 0515 12/22/15 1034 12/22/15 1307  BP:  103/64 111/63 108/51  Pulse:  97  100  Temp: 99.1 F (37.3 C) 98.4 F (36.9 C)  99.2 F (37.3 C)  TempSrc: Oral Oral  Oral  Resp:  16  22  Height:      Weight:      SpO2:  95%  92%    Intake/Output Summary (Last 24 hours) at 12/22/15 1457 Last data filed at 12/22/15 0840  Gross per 24 hour  Intake   3940 ml  Output      0 ml  Net   3940 ml   Filed Weights   12/20/15 2243 12/21/15 0140  Weight: 90.266 kg (199 lb) 87.408 kg (192 lb 11.2 oz)    Exam:  General exam: Pleasant middle-aged male lying comfortably propped up in bed. Does not look septic or toxic. Respiratory system: Decreased breath sounds in right base. Rest of lung fields clear to auscultation. No pleural rub heard. No increased work of breathing. Cardiovascular system: S1 & S2 heard, RRR. No JVD, murmurs, gallops, clicks or pedal edema. Gastrointestinal system: Abdomen is nondistended, soft and nontender. Normal bowel sounds heard. Central nervous system: Alert and oriented. No focal neurological deficits. Extremities: Symmetric 5 x 5 power.   Data Reviewed: Basic Metabolic Panel:  Recent Labs Lab 12/20/15 2331 12/21/15 0342 12/22/15 0350  NA  137 138 139  K 3.8 3.8 4.0  CL 102 101 104  CO2 GLUCOSE 128* 112* 119*  BUN CREATININE 1.46* 1.42* 1.64*  CALCIUM 8.6* 8.6* 8.4*   Liver Function Tests:  Recent Labs Lab 12/20/15 2331  12/21/15 0342  AST 18 17  ALT 12* 12*  ALKPHOS 102 92  BILITOT 0.9 0.7  PROT 7.1 6.7  ALBUMIN 3.2* 3.0*    Recent Labs Lab 12/20/15 2331  LIPASE 35   No results for input(s): AMMONIA in the last 168 hours. CBC:  Recent Labs Lab 12/20/15 2256 12/21/15 0342 12/22/15 0350  WBC 18.9* 18.8* 27.0*  NEUTROABS 15.9* 15.7*  --   HGB 12.7* 12.4* 12.1*  HCT 38.8* 37.8* 34.8*  MCV 89.2 89.4 85.5  PLT 277 264 242   Cardiac Enzymes: No results for input(s): CKTOTAL, CKMB, CKMBINDEX, TROPONINI in the last 168 hours. BNP (last 3 results) No results for input(s): PROBNP in the last 8760 hours. CBG: No results for input(s): GLUCAP in the last 168 hours.  Recent Results (from the past 240 hour(s))  Culture, blood (routine x 2)     Status: None (Preliminary result)   Collection Time: 12/20/15 10:55 PM  Result Value Ref Range Status   Specimen Description BLOOD RIGHT ANTECUBITAL  Final   Special Requests BOTTLES DRAWN AEROBIC AND ANAEROBIC 5 ML  Final   Culture   Final    NO GROWTH 1 DAY Performed at [facility]    Report Status PENDING  Incomplete  Culture, blood (routine x 2)     Status: None (Preliminary result)   Collection Time: 12/20/15 10:56 PM  Result Value Ref Range Status   Specimen Description BLOOD BLOOD LEFT FOREARM  Final   Special Requests BOTTLES DRAWN AEROBIC AND ANAEROBIC 5 ML  Final   Culture   Final    NO GROWTH 1 DAY Performed at [facility]    Report Status PENDING  Incomplete  Urine culture     Status: None   Collection Time: 12/21/15  2:01 AM  Result Value Ref Range Status   Specimen Description URINE, RANDOM  Final   Special Requests NONE  Final   Culture   Final    NO GROWTH 1 DAY Performed at [facility]    Report Status 12/22/2015 FINAL  Final         Studies: Dg Chest 2 View  12/20/2015  CLINICAL DATA:  Fever and right upper quadrant pain. EXAM: CHEST  2 VIEW COMPARISON:  None. FINDINGS: Airspace infiltration  in the right lung base with small right pleural effusion. This may indicate pneumonia. Followup PA and lateral chest X-ray is recommended in 3-4 weeks following trial of antibiotic therapy to ensure resolution and exclude underlying malignancy. Left lung is clear. Normal heart size and pulmonary vascularity. No pneumothorax. Degenerative changes in the thoracic spine and shoulders. IMPRESSION: Right lung base infiltration with small right pleural effusion likely representing pneumonia. Electronically Signed   By: Burman Nieves M.D.   On: 12/20/2015 23:31   Ct Chest Wo Contrast  12/21/2015  CLINICAL DATA:  65 year old male with chest pain and shortness of breath for 1 month. Initial encounter. EXAM: CT CHEST WITHOUT CONTRAST TECHNIQUE: Multidetector CT imaging of the chest was performed following the standard protocol without IV contrast. COMPARISON:  Chest radiographs 12/20/2015. FINDINGS: Moderate layering right pleural effusion, with a component of pleural fluid tracking into the major fissure. Small volume  dependent retained secretions or debris in the trachea on series 5, image 12. Otherwise Major airways are patent. Consolidation in the right lower lobe adjacent to the pleural fluid. Atelectasis or less likely early consolidation in the inferior right middle lobe. Trace if any left pleural fluid. Linear atelectasis or scarring in the left lower lobe. No pericardial effusion. Suggestion of mild right hilar lymphadenopathy (series 2, image 34). Otherwise no thoracic lymphadenopathy. Negative noncontrast thoracic inlet. Mild calcified coronary artery atherosclerosis (series 2, image 34). Negative noncontrast visible spleen, pancreas, adrenal glands, kidneys, and bowel in the upper abdomen. Negative noncontrast liver; circumscribed 12 mm low-density area in the left lobe appears benign such as cyst. No acute osseous abnormality identified. IMPRESSION: 1. Right lower lobe consolidation compatible with  pneumonia. Superimposed moderate size right pleural effusion, mostly layering but also tracking into the pleural fissures. Inferior right middle lobe opacity favored to be atelectasis. 2. Suggestion of reactive type right hilar lymphadenopathy. Small volume layering debris or secretions in the trachea. 3. No other acute findings in the noncontrast chest. Mild calcified coronary artery atherosclerosis. Followup PA and lateral chest X-ray is recommended in 3-4 weeks following trial of antibiotic therapy to ensure resolution and exclude underlying malignancy. Electronically Signed   By: Odessa FlemingH  Hall M.D.   On: 12/21/2015 08:34   [place] Renal  12/22/2015  CLINICAL DATA:  Acute renal insufficiency.  Hypertension. EXAM: RENAL / URINARY TRACT ULTRASOUND COMPLETE COMPARISON:  None. FINDINGS: Right Kidney: Length: 11.7 cm. Mild increased cortical echogenicity. No mass or hydronephrosis visualized. Left Kidney: Length: 10.6 cm. Mild increased cortical echogenicity. Few small cysts all less than 2 cm in size. No hydronephrosis. Bladder: Appears normal for degree of bladder distention. IMPRESSION: Normal size kidneys without evidence of hydronephrosis. Mild increased cortical echogenicity which can be seen with medical renal disease. Three small left renal cysts. Electronically Signed   By: Elberta Fortisaniel  Boyle M.D.   On: 12/22/2015 11:00        Scheduled Meds: . amLODipine  5 mg Oral Daily  . aspirin EC  81 mg Oral Daily  . azithromycin  500 mg Intravenous Q24H  . benztropine  0.5 mg Oral BID  . carbamazepine  400 mg Oral QHS  . cefTRIAXone (ROCEPHIN)  IV  1 g Intravenous Q24H  . docusate sodium  100 mg Oral BID  . enoxaparin (LOVENOX) injection  40 mg Subcutaneous Q24H  . haloperidol  20 mg Oral QHS  . loratadine  10 mg Oral Daily  . simvastatin  10 mg Oral QHS   Continuous Infusions: . sodium chloride      Principal Problem:   CAP (community acquired pneumonia) Active Problems:   HTN (hypertension)   HLD  (hyperlipidemia)    Time spent: 25 minutes.    Marcellus ScottHONGALGI,Perry Molla, MD, FACP, FHM. [facility] Pager [phone]  If 7PM-7AM, please contact night-coverage [email or web address] Password [email or web address] 12/22/2015, 2:57 PM    LOS: 1 day

## 2015-12-22 NOTE — Clinical Documentation Improvement (Signed)
  Hospitalist  Can the diagnosis of renal failure be further specified?   Acute Renal Failure/Acute Kidney Injury  Acute Tubular Necrosis  Acute Renal Cortical Necrosis  Acute Renal Medullary Necrosis  Acute on Chronic Renal Failure  Chronic Renal Failure  Other  Clinically Undetermined  Document any associated diagnoses/conditions.   Supporting Information: Renal failure, baseline creatinine not known per 3/22 progress notes. Labs:   Bun   Creat   GFR: 3/23:     20     1.64      50 3/22:     20     1.42      59 3/21:     20     1.46      57   Please exercise your independent, professional judgment when responding. A specific answer is not anticipated or expected.   Thank Sabino DonovanYou,  [facility] [place] [phone]

## 2015-12-22 NOTE — Clinical Social Work Note (Signed)
Clinical Social Work Assessment  Patient Details  Name: Binyamin Nelis MRN: [id number] Date of Birth: 07-21-1951  Date of referral:  12/22/15               Reason for consult:  Discharge Planning                Permission sought to share information with:  Family Supports Permission granted to share information::  Yes, Verbal Permission Granted  Name::     Ethaniel Garfield  Agency::     Relationship::  brother  Contact Information:  [phone]  Housing/Transportation Living arrangements for the past 2 months:  [place] of Information:  Patient, Other (Comment Required) (brother) Patient Interpreter Needed:  None Criminal Activity/Legal Involvement Pertinent to Current Situation/Hospitalization:  No - Comment as needed Significant Relationships:  Siblings Lives with:  Facility Resident Do you feel safe going back to the place where you live?  Yes Need for family participation in patient care:  Yes (Comment)  Care giving concerns:  Pt admitted from [place]. No care giving concerns identified.    Social Worker assessment / plan:  CSW received consult that pt admitted from [place].   CSW met with pt at bedside. CSW introduced self and explained role. Pt confirmed he resides at [facility] and plans to return. Pt reports that pt brother, Iona Beard assist with pt decision making.  CSW spoke with pt brother, Laqueta Due via telephone. Pt brother confirmed plans to return to [facility] when medically ready. CSW clarified pt brother questions and concerns.   CSW spoke with Shanon Brow at [facility]. Shanon Brow reports that group home can accept pt back and will only need DC summary and any new scripts for new medications started. No FL2 needed upon discharge.  CSW to continue to follow to assist with pt return to [facility] when pt medically stable.   Employment  status:  Disabled (Comment on whether or not currently receiving Disability) Insurance information:  Medicare PT Recommendations:  Not assessed at this time Information / Referral to community resources:  Other (Comment Required) (Referral back to [place])  Patient/Family's Response to care:  Pt alert and oriented x 4. Pt reports pt brother, Iona Beard assist with decisions. All are in agreement to pt return to [place].   Patient/Family's Understanding of and Emotional Response to Diagnosis, Current Treatment, and Prognosis:  Pt brother updated regarding pt diagnosis and provided contact number for nurses station if pt brother wants update from RN.   Emotional Assessment Appearance:  Appears stated age Attitude/Demeanor/Rapport:  Other (appropriate) Affect (typically observed):  Appropriate Orientation:  Oriented to Self, Oriented to Place, Oriented to  Time, Oriented to Situation, Fluctuating Orientation (Suspected and/or reported Sundowners) Alcohol / Substance use:  Not Applicable Psych involvement (Current and /or in the community):  No (Comment)  Discharge Needs  Concerns to be addressed:  Discharge Planning Concerns Readmission within the last 30 days:  No Current discharge risk:  None Barriers to Discharge:  Continued Medical Work up   Ladell Pier, LCSW 12/22/2015, 4:51 PM  [phone]

## 2015-12-23 ENCOUNTER — Inpatient Hospital Stay (HOSPITAL_COMMUNITY): Payer: Medicare Other

## 2015-12-23 DIAGNOSIS — J189 Pneumonia, unspecified organism: Principal | ICD-10-CM

## 2015-12-23 DIAGNOSIS — J90 Pleural effusion, not elsewhere classified: Secondary | ICD-10-CM

## 2015-12-23 LAB — BASIC METABOLIC PANEL
ANION GAP: 8 (ref 5–15)
BUN: 17 mg/dL (ref 6–20)
CALCIUM: 8.2 mg/dL — AB (ref 8.9–10.3)
CO2: 23 mmol/L (ref 22–32)
Chloride: 107 mmol/L (ref 101–111)
Creatinine, Ser: 1.31 mg/dL — ABNORMAL HIGH (ref 0.61–1.24)
GFR calc non Af Amer: 56 mL/min — ABNORMAL LOW (ref >60)
Glucose, Bld: 126 mg/dL — ABNORMAL HIGH (ref 65–99)
Potassium: 3.5 mmol/L (ref 3.5–5.1)
SODIUM: 138 mmol/L (ref 135–145)

## 2015-12-23 LAB — CBC
HEMATOCRIT: 33.2 % — AB (ref 39.0–52.0)
HEMOGLOBIN: 11.3 g/dL — AB (ref 13.0–17.0)
MCH: 29.5 pg (ref 26.0–34.0)
MCHC: 34 g/dL (ref 30.0–36.0)
MCV: 86.7 fL (ref 78.0–100.0)
Platelets: 262 10*3/uL (ref 150–400)
RBC: 3.83 MIL/uL — ABNORMAL LOW (ref 4.22–5.81)
RDW: 12.8 % (ref 11.5–15.5)
WBC: 22 10*3/uL — AB (ref 4.0–10.5)

## 2015-12-23 LAB — EXPECTORATED SPUTUM ASSESSMENT W REFEX TO RESP CULTURE

## 2015-12-23 LAB — EXPECTORATED SPUTUM ASSESSMENT W GRAM STAIN, RFLX TO RESP C

## 2015-12-23 LAB — PROTIME-INR
INR: 1.28 (ref 0.00–1.49)
PROTHROMBIN TIME: 15.7 s — AB (ref 11.6–15.2)

## 2015-12-23 LAB — APTT: APTT: 28 s (ref 24–37)

## 2015-12-23 MED ORDER — SODIUM CHLORIDE 0.9 % IV SOLN
INTRAVENOUS | Status: DC
  Administered 2015-12-23 – 2015-12-24 (×2): via INTRAVENOUS

## 2015-12-23 NOTE — Progress Notes (Signed)
CSW continuing to follow.   Per MD, pt not yet medically ready for discharge today, but potential discharge over the weekend.   CSW spoke with [facility] and updated group home. Group Home confirms that facility can accept pt back over the weekend. Weekend social worker to call group home [phone] regarding discharge. [facility] states that they need discharge summary and any hard scripts for any new medications orders. FL2 not needed. [facility] states that facility can provide transportation for pt upon discharge. CS[facility] inquired with [facility] who pt primary care doctor is and facility reports that [facility] comes to the facility and pt [place]. CSW provided RNCM with information.   CSW updated pt brother, Ian Carter via telephone. Pt brother, Ian Carter appreciative of updates and states that if there are any barriers with group home providing transportation to notify him and he can assist.   CSW to continue to follow to provide support and assist with pt discharge back to [facility] when medically ready.  Ian Carter, MSW, LCSW Clinical Social Work [phone]

## 2015-12-23 NOTE — Progress Notes (Signed)
MD notified on the results of 2 View chest x-ray.

## 2015-12-23 NOTE — Progress Notes (Signed)
PROGRESS NOTE    Ian Carter  UJW:[id number]  DOB: 01/02/1951  DOA: 12/20/2015 PCP: No primary care provider on file. Outpatient Specialists:   Hospital course: 65 year old male, supposedly resident of a group home, history of schizophrenia, HTN, HLD, presented to ED with right-sided pleuritic type of chest pain, cough, some dyspnea. In the ED, febrile 102F and chest x-ray showed right-sided infiltrate with effusion. CT chest confirmed RLL PNA & moderate R sided pleural effusion. Admitted for community-acquired pneumonia. Pulmonology consulted for moderate size right pleural effusion and are considering thoracentesis.   Assessment & Plan:   RLL Community-acquired pneumonia with moderate pleural effusion (possibly parapneumonic) - Influenza panel PCR negative. Urine Legionella antigen: Pending. Urinary streptococcal antigen: Negative. HIV antibody: Nonreactive. - Treating empirically with IV Rocephin and azithromycin. - CT chest 3/22: Right lower lobe consolidation compatible with pneumonia, superimposed moderate-sized right pleural effusion. - will need follow-up chest x-ray in 3-4 weeks to ensure resolution of pneumonia findings. -  Blood cultures 2: negative to date. Urine culture times one: Negative. -  Patient did spike fever of  102.6 overnight 3/22. If has recurrent high fevers, consider broadening antibiotics and or thoracentesis - No further fever spikes. Persisting leukocytosis. Repeat chest x-ray shows moderate size right pleural effusion/worsening opacity. -CCM consulted: Complicated right pleural effusion (appears loculated by bedside ultrasound) with significant right-sided atelectasis/lung collapse. Repeating CT chest without contrast followed by diagnostic and therapeutic thoracentesis either by pulmonology or interventional radiology.   Essential hypertension - Controlled on amlodipine. HCTZ held temporarily.  Hyperlipidemia - Continue  statins.  Schizophrenia - Stable without complaints. Continue home medications-haloperidol and benztropine. Patient apparently is on carbamazepine at home for unclear indications.  Possible stage III chronic kidney disease - Baseline creatinine unknown. Creatinine stable in the 1.4 range. Creatinine up 1.64on 3/23- brief IVF and follow. Creatinine improved. - Do not know baseline creatinine to say that he has acute decline. -  Renal ultrasound without hydronephrosis.   Leukocytosis -  Most likely secondary to pneumonia. No diarrhea reported. Follow CBCs. Persisting.? Need to rule out empyema.  Anemia - Follow CBCs.    DVT prophylaxis: Lovenox Code Status: Full Family Communication: None at bedside Disposition Plan: DC home when medically stable.   Consultants:  Pulmonology  Procedures:  None  Antimicrobials:  Azithromycin 3/21 >  Rocephin 3/21 >   Subjective: Denied dyspnea or chest pain this morning. Mild dry cough.  Objective: Filed Vitals:   12/22/15 2057 12/23/15 0555 12/23/15 1000 12/23/15 1507  BP: 126/65 129/78 124/74 119/74  Pulse: 108 110  104  Temp: 99.6 F (37.6 C) 99 F (37.2 C)  98.7 F (37.1 C)  TempSrc: Oral Oral  Oral  Resp: Height:      Weight:      SpO2: 94% 94%  95%    Intake/Output Summary (Last 24 hours) at 12/23/15 1546 Last data filed at 12/23/15 1350  Gross per 24 hour  Intake   2364 ml  Output    400 ml  Net   1964 ml   Filed Weights   12/20/15 2243 12/21/15 0140  Weight: 90.266 kg (199 lb) 87.408 kg (192 lb 11.2 oz)    Exam:  General exam: Pleasant middle-aged male lying comfortably propped up in bed. Does not look septic or toxic. Respiratory system: Decreased breath sounds in right base. Rest of lung fields clear to auscultation. No pleural rub heard. No increased work of breathing. Cardiovascular system: S1 & S2 heard,  RRR. No JVD, murmurs, gallops, clicks or pedal edema. Gastrointestinal system: Abdomen  is nondistended, soft and nontender. Normal bowel sounds heard. Central nervous system: Alert and oriented. No focal neurological deficits. Extremities: Symmetric 5 x 5 power.   Data Reviewed: Basic Metabolic Panel:  Recent Labs Lab 12/20/15 2331 12/21/15 0342 12/22/15 0350 12/23/15 0352  NA 137 138 139 138  K 3.8 3.8 4.0 3.5  CL 102 101 104 107  CO2 24 27 28 23   GLUCOSE 128* 112* 119* 126*  BUN 20 20 20 17   CREATININE 1.46* 1.42* 1.64* 1.31*  CALCIUM 8.6* 8.6* 8.4* 8.2*   Liver Function Tests:  Recent Labs Lab 12/20/15 2331 12/21/15 0342  AST 18 17  ALT 12* 12*  ALKPHOS 102 92  BILITOT 0.9 0.7  PROT 7.1 6.7  ALBUMIN 3.2* 3.0*    Recent Labs Lab 12/20/15 2331  LIPASE 35   No results for input(s): AMMONIA in the last 168 hours. CBC:  Recent Labs Lab 12/20/15 2256 12/21/15 0342 12/22/15 0350 12/23/15 0352  WBC 18.9* 18.8* 27.0* 22.0*  NEUTROABS 15.9* 15.7*  --   --   HGB 12.7* 12.4* 12.1* 11.3*  HCT 38.8* 37.8* 34.8* 33.2*  MCV 89.2 89.4 85.5 86.7  PLT 277 264 242 262   Cardiac Enzymes: No results for input(s): CKTOTAL, CKMB, CKMBINDEX, TROPONINI in the last 168 hours. BNP (last 3 results) No results for input(s): PROBNP in the last 8760 hours. CBG: No results for input(s): GLUCAP in the last 168 hours.  Recent Results (from the past 240 hour(s))  Culture, blood (routine x 2)     Status: None (Preliminary result)   Collection Time: 12/20/15 10:55 PM  Result Value Ref Range Status   Specimen Description BLOOD RIGHT ANTECUBITAL  Final   Special Requests BOTTLES DRAWN AEROBIC AND ANAEROBIC 5 ML  Final   Culture   Final    NO GROWTH 2 DAYS Performed at [facility]    Report Status PENDING  Incomplete  Culture, blood (routine x 2)     Status: None (Preliminary result)   Collection Time: 12/20/15 10:56 PM  Result Value Ref Range Status   Specimen Description BLOOD BLOOD LEFT FOREARM  Final   Special Requests BOTTLES DRAWN AEROBIC AND  ANAEROBIC 5 ML  Final   Culture   Final    NO GROWTH 2 DAYS Performed at [facility]    Report Status PENDING  Incomplete  Urine culture     Status: None   Collection Time: 12/21/15  2:01 AM  Result Value Ref Range Status   Specimen Description URINE, RANDOM  Final   Special Requests NONE  Final   Culture   Final    NO GROWTH 1 DAY Performed at [facility]    Report Status 12/22/2015 FINAL  Final  Culture, sputum-assessment     Status: None   Collection Time: 12/23/15  6:37 AM  Result Value Ref Range Status   Specimen Description SPUTUM  Final   Special Requests NONE  Final   Sputum evaluation   Final    MICROSCOPIC FINDINGS SUGGEST THAT THIS SPECIMEN IS NOT REPRESENTATIVE OF LOWER RESPIRATORY SECRETIONS. PLEASE RECOLLECT. NOTIFIED RN AT [phone] ON 03.24.17 BY SHUEA    Report Status 12/23/2015 FINAL  Final         Studies: Dg Chest 2 View  12/23/2015  CLINICAL DATA:  Short of breath.  Pleural effusion. EXAM: CHEST  2 VIEW COMPARISON:  12/20/2015 FINDINGS: Large wedge-shaped  opacity occupying the right mid and lower lung zone is worse. This is likely a combination of loculated pleural fluid and consolidation. No pneumothorax. Minimal basilar atelectasis. Normal heart size. IMPRESSION: Worsening large opacity in the right hemi thorax likely a combination of pleural fluid and consolidation. Electronically Signed   By: Jolaine Click M.D.   On: 12/23/2015 08:50   US Renal  12/22/2015  CLINICAL DATA:  Acute renal insufficiency.  Hypertension. EXAM: RENAL / URINARY TRACT ULTRASOUND COMPLETE COMPARISON:  None. FINDINGS: Right Kidney: Length: 11.7 cm. Mild increased cortical echogenicity. No mass or hydronephrosis visualized. Left Kidney: Length: 10.6 cm. Mild increased cortical echogenicity. Few small cysts all less than 2 cm in size. No hydronephrosis. Bladder: Appears normal for degree of bladder distention. IMPRESSION: Normal size kidneys without evidence of  hydronephrosis. Mild increased cortical echogenicity which can be seen with medical renal disease. Three small left renal cysts. Electronically Signed   By: Elberta Fortis M.D.   On: 12/22/2015 11:00        Scheduled Meds: . amLODipine  5 mg Oral Daily  . aspirin EC  81 mg Oral Daily  . azithromycin  500 mg Intravenous Q24H  . benztropine  0.5 mg Oral BID  . carbamazepine  400 mg Oral QHS  . cefTRIAXone (ROCEPHIN)  IV  1 g Intravenous Q24H  . docusate sodium  100 mg Oral BID  . enoxaparin (LOVENOX) injection  40 mg Subcutaneous Q24H  . haloperidol  20 mg Oral QHS  . loratadine  10 mg Oral Daily  . simvastatin  10 mg Oral QHS   Continuous Infusions:    Principal Problem:   CAP (community acquired pneumonia) Active Problems:   HTN (hypertension)   HLD (hyperlipidemia)    Time spent: 25 minutes.    Marcellus Scott, MD, FACP, FHM. [facility] Pager [phone]  If 7PM-7AM, please contact night-coverage [email or web address] Password [facility] 12/23/2015, 3:46 PM    LOS: 2 days

## 2015-12-23 NOTE — Progress Notes (Signed)
Pt to return to group home.  CSW received PCP info from Group home rep.  PCP info placed on AVS.  No other CM needs communicated. Sandford Crazeora Nyia Tsao RN,BSN,NCM [phone]

## 2015-12-23 NOTE — Consult Note (Signed)
Name: Ian Carter MRN: [id number] DOB: [birth year]    ADMISSION DATE:  12/20/2015 CONSULTATION DATE:  3/24  REFERRING MD :  Waymon Amato   CHIEF COMPLAINT:  Pneumonia and effusion   BRIEF PATIENT DESCRIPTION:  65 year old male admitted 3/22 w/ working dx of CAP (NOS). Treated w/ appropriate abx. Clinically felt better but CXR on 3/24 showed remarkably increased opacification occupying more than 2/3 the left hemi-thorax. PCCM was asked to see re: these CXR findings.   SIGNIFICANT EVENTS   STUDIES:  CT chest 3/22: 1. Right lower lobe consolidation compatible with pneumonia Superimposed moderate size right pleural effusion, mostly layering but also tracking into the pleural fissures. Inferior right middle lobe opacity favored to be atelectasis. 2. Suggestion of reactive type right hilar lymphadenopathy. Small volume layering debris or secretions in the trachea.   HISTORY OF PRESENT ILLNESS:   65 y.o. male with history of hypertension, hyperlipidemia, schizophrenia presents to the ER on 3/22 because of persistent right-sided chest pain for last 4-5 days. Patient stated he has been having constant pleuritic type of right-sided chest pain. Had been having some cough. Denied any recent sick contacts or travel. Patient is a poor historian. Patient is found to be febrile with temperatures around 102F in the ER and chest x-ray shows a right-sided infiltrate with effusion. Patient has been admitted for pneumonia. Treated w/ supplemental oxygen, pulmonary hygiene measures, and empiric antibiotics. U strep and legionella antigen were negative. Influenza PCR was negative. He clinically improved w/ these measures however a CXR was obtained on 3/24 which showed large wedge shaped opacification occupying a significant component of the right hemithorax. PCCM was asked to see re: these findings.   PAST MEDICAL HISTORY :   has a past medical history of Schizophrenia (HCC); Anxiety; Seizures (HCC);  Hypertension; and Dyslipidemia.  has no past surgical history on file. Prior to Admission medications   Medication Sig Start Date End Date Taking? Authorizing Provider  acetaminophen (TYLENOL) 500 MG tablet Take 500 mg by mouth every 8 (eight) hours as needed for mild pain, moderate pain or headache.   Yes Historical Provider, MD  amLODipine (NORVASC) 5 MG tablet Take 5 mg by mouth daily.   Yes Historical Provider, MD  aspirin 81 MG tablet Take 81 mg by mouth daily.   Yes Historical Provider, MD  benztropine (COGENTIN) 0.5 MG tablet Take 0.5 mg by mouth 2 (two) times daily.   Yes Historical Provider, MD  carbamazepine (TEGRETOL) 200 MG tablet Take 400 mg by mouth at bedtime.   Yes Historical Provider, MD  cetirizine (ZYRTEC) 10 MG tablet Take 10 mg by mouth at bedtime.   Yes Historical Provider, MD  cholecalciferol (VITAMIN D) 400 units TABS tablet Take 400 Units by mouth daily.   Yes Historical Provider, MD  docusate sodium (COLACE) 100 MG capsule Take 100 mg by mouth 2 (two) times daily.   Yes Historical Provider, MD  haloperidol (HALDOL) 20 MG tablet Take 20 mg by mouth at bedtime.   Yes Historical Provider, MD  hydrochlorothiazide (MICROZIDE) 12.5 MG capsule Take 12.5 mg by mouth daily.   Yes Historical Provider, MD  meloxicam (MOBIC) 7.5 MG tablet Take 7.5 mg by mouth daily.   Yes Historical Provider, MD  simvastatin (ZOCOR) 10 MG tablet Take 10 mg by mouth at bedtime.   Yes Historical Provider, MD  Skin Protectants, Misc. (EUCERIN) cream Apply 1 application topically 2 (two) times daily.   Yes Historical Provider, MD   No Known Allergies  FAMILY HISTORY:  family history includes Diabetes Mellitus II in his brother. SOCIAL HISTORY:  reports that he has been smoking Cigars.  He does not have any smokeless tobacco history on file. He reports that he does not drink alcohol or use illicit drugs.  REVIEW OF SYSTEMS:   Constitutional: Negative for fever, chills, weight loss, malaise/fatigue  and diaphoresis.  HENT: Negative for hearing loss, ear pain, nosebleeds, congestion, sore throat, neck pain, tinnitus and ear discharge.   Eyes: Negative for blurred vision, double vision, photophobia, pain, discharge and redness.  Respiratory: resolved cough, hemoptysis, no more sputum production, shortness of breath + activity , wheezing and stridor.  right shoulder pain  Cardiovascular: Negative for chest pain, palpitations, orthopnea, claudication, leg swelling and PND.  Gastrointestinal: Negative for heartburn, nausea, vomiting, abdominal pain, diarrhea, constipation, blood in stool and melena.  Genitourinary: Negative for dysuria, urgency, frequency, hematuria and flank pain.  Musculoskeletal: Negative for myalgias, back pain, joint pain and falls.  Skin: Negative for itching and rash.  Neurological: Negative for dizziness, tingling, tremors, sensory change, speech change, focal weakness, seizures, loss of consciousness, weakness and headaches.  Endo/Heme/Allergies: Negative for environmental allergies and polydipsia. Does not bruise/bleed easily.  SUBJECTIVE:  Feels better  VITAL SIGNS: Temp:  [99 F (37.2 C)-99.6 F (37.6 C)] 99 F (37.2 C) (03/24 0555) Pulse Rate:  [100-110] 110 (03/24 0555) Resp:  [18-22] 18 (03/24 0555) BP: (108-129)/(51-78) 124/74 mmHg (03/24 1000) SpO2:  [92 %-94 %] 94 % (03/24 0555)  PHYSICAL EXAMINATION: General:  65 year old aam. No acute distress. Well nourished.  Neuro:  Awake, alert, no focal def  HEENT:  NCAT, MM are moist, no JVD  Cardiovascular:  Rrr, no MRG Lungs:  Decreased on right, some exp wheeze  Abdomen:  Soft, no tenderness. + bowel sound  Musculoskeletal:  Equal st and bulk  Skin:  Warm and dry    Recent Labs Lab 12/21/15 0342 12/22/15 0350 12/23/15 0352  NA 138 139 138  K 3.8 4.0 3.5  CL 101 104 107  CO2 27 28 23   BUN 20 20 17   CREATININE 1.42* 1.64* 1.31*  GLUCOSE 112* 119* 126*    Recent Labs Lab 12/21/15 0342  12/22/15 0350 12/23/15 0352  HGB 12.4* 12.1* 11.3*  HCT 37.8* 34.8* 33.2*  WBC 18.8* 27.0* 22.0*  PLT 264 242 262   Dg Chest 2 View  12/23/2015  CLINICAL DATA:  Short of breath.  Pleural effusion. EXAM: CHEST  2 VIEW COMPARISON:  12/20/2015 FINDINGS: Large wedge-shaped opacity occupying the right mid and lower lung zone is worse. This is likely a combination of loculated pleural fluid and consolidation. No pneumothorax. Minimal basilar atelectasis. Normal heart size. IMPRESSION: Worsening large opacity in the right hemi thorax likely a combination of pleural fluid and consolidation. Electronically Signed   By: Jolaine ClickArthur  Hoss M.D.   On: 12/23/2015 08:50   [place] Renal  12/22/2015  CLINICAL DATA:  Acute renal insufficiency.  Hypertension. EXAM: RENAL / URINARY TRACT ULTRASOUND COMPLETE COMPARISON:  None. FINDINGS: Right Kidney: Length: 11.7 cm. Mild increased cortical echogenicity. No mass or hydronephrosis visualized. Left Kidney: Length: 10.6 cm. Mild increased cortical echogenicity. Few small cysts all less than 2 cm in size. No hydronephrosis. Bladder: Appears normal for degree of bladder distention. IMPRESSION: Normal size kidneys without evidence of hydronephrosis. Mild increased cortical echogenicity which can be seen with medical renal disease. Three small left renal cysts. Electronically Signed   By: Elberta Fortisaniel  Boyle M.D.   On:  12/22/2015 11:00  PCXR: marked right sided opacification. Remarkably larger than admit occupying about 2/3 right chest.  Bedside US: mod/lg effusion. Seems loculated under right scapula.   ASSESSMENT / PLAN: CAP(NOS) Complicated right pleural effusion (appears loculated by bedside US) w/ significant right sided atelectasis/lung collapse Plan Repeat CT chest (non-contrast) Diagnostic/therapeutic thoracentesis w/ full diagnostics to be sent (pending CT)-->may need IR drainage Cont current abx Add pulmonary hygiene: Nebs, IS, flutter and mucinex  F/u serial cxr for  re-occurrence  I am concerned that this may require surgical intervention. He states he "will not have a surgery". Not convinced he understands the potential consequences.   Mild AKI Per IM service   Simonne Martinet ACNP-BC [facility] Pulmonary/Critical Care Pager # [phone] OR # [phone] if no answer   12/23/2015, 11:22 AM

## 2015-12-24 DIAGNOSIS — I10 Essential (primary) hypertension: Secondary | ICD-10-CM

## 2015-12-24 DIAGNOSIS — E785 Hyperlipidemia, unspecified: Secondary | ICD-10-CM

## 2015-12-24 DIAGNOSIS — N179 Acute kidney failure, unspecified: Secondary | ICD-10-CM | POA: Insufficient documentation

## 2015-12-24 DIAGNOSIS — J90 Pleural effusion, not elsewhere classified: Secondary | ICD-10-CM | POA: Insufficient documentation

## 2015-12-24 LAB — CBC
HEMATOCRIT: 33.1 % — AB (ref 39.0–52.0)
Hemoglobin: 11.4 g/dL — ABNORMAL LOW (ref 13.0–17.0)
MCH: 29.7 pg (ref 26.0–34.0)
MCHC: 34.4 g/dL (ref 30.0–36.0)
MCV: 86.2 fL (ref 78.0–100.0)
PLATELETS: 280 10*3/uL (ref 150–400)
RBC: 3.84 MIL/uL — AB (ref 4.22–5.81)
RDW: 12.8 % (ref 11.5–15.5)
WBC: 17.8 10*3/uL — AB (ref 4.0–10.5)

## 2015-12-24 LAB — BASIC METABOLIC PANEL
ANION GAP: 10 (ref 5–15)
BUN: 16 mg/dL (ref 6–20)
CALCIUM: 8.5 mg/dL — AB (ref 8.9–10.3)
CO2: 25 mmol/L (ref 22–32)
Chloride: 107 mmol/L (ref 101–111)
Creatinine, Ser: 1.32 mg/dL — ABNORMAL HIGH (ref 0.61–1.24)
GFR, EST NON AFRICAN AMERICAN: 55 mL/min — AB (ref >60)
GLUCOSE: 147 mg/dL — AB (ref 65–99)
POTASSIUM: 3.6 mmol/L (ref 3.5–5.1)
Sodium: 142 mmol/L (ref 135–145)

## 2015-12-24 NOTE — Consult Note (Addendum)
Name: Aasim Restivo MRN: [id number] DOB: 09-08-1951    ADMISSION DATE:  12/20/2015 CONSULTATION DATE:  3/24  REFERRING MD :  Waymon Amato   CHIEF COMPLAINT:  Pneumonia and effusion   BRIEF PATIENT DESCRIPTION:  65 year old male admitted 3/22 w/ working dx of CAP (NOS). Treated w/ appropriate abx. Clinically felt better but CXR on 3/24 showed remarkably increased opacification occupying more than 2/3 the left hemi-thorax. PCCM was asked to see re: these CXR findings.   SIGNIFICANT EVENTS   STUDIES:  CT chest 3/22: 1. Right lower lobe consolidation compatible with pneumonia Superimposed moderate size right pleural effusion, mostly layering but also tracking into the pleural fissures. Inferior right middle lobe opacity favored to be atelectasis. 2. Suggestion of reactive type right hilar lymphadenopathy. Small volume layering debris or secretions in the trachea.   HISTORY OF PRESENT ILLNESS:   65 y.o. male with history of hypertension, hyperlipidemia, schizophrenia presents to the ER on 3/22 because of persistent right-sided chest pain for last 4-5 days. Patient stated he has been having constant pleuritic type of right-sided chest pain. Had been having some cough. Denied any recent sick contacts or travel. Patient is a poor historian. Patient is found to be febrile with temperatures around 102F in the ER and chest x-ray shows a right-sided infiltrate with effusion. Patient has been admitted for pneumonia. Treated w/ supplemental oxygen, pulmonary hygiene measures, and empiric antibiotics. U strep and legionella antigen were negative. Influenza PCR was negative. He clinically improved w/ these measures however a CXR was obtained on 3/24 which showed large wedge shaped opacification occupying a significant component of the right hemithorax. PCCM was asked to see re: these findings.   SUBJECTIVE:  No distress  VITAL SIGNS: Temp:  [98.7 F (37.1 C)-99.9 F (37.7 C)] 99.9 F (37.7 C) (03/25  0532) Pulse Rate:  [93-104] 93 (03/25 0532) Resp:  [18-20] 18 (03/25 0532) BP: (119-131)/(68-74) 126/68 mmHg (03/25 0532) SpO2:  [95 %] 95 % (03/25 0532)  PHYSICAL EXAMINATION: General:  65 year old aam. No acute distress. Well nourished.  Neuro:  Awake, alert, no focal def  HEENT:  jvd wnl Cardiovascular:  Rrr, no MRG Lungs:  Decreased on right Abdomen:  Soft, no tenderness. + bowel sound  Musculoskeletal:  Equal st and bulk  Skin:  Warm and dry    Recent Labs Lab 12/22/15 0350 12/23/15 0352 12/24/15 0353  NA 139 138 142  K 4.0 3.5 3.6  CL 104 107 107  CO2 BUN CREATININE 1.64* 1.31* 1.32*  GLUCOSE 119* 126* 147*    Recent Labs Lab 12/22/15 0350 12/23/15 0352 12/24/15 0353  HGB 12.1* 11.3* 11.4*  HCT 34.8* 33.2* 33.1*  WBC 27.0* 22.0* 17.8*  PLT 242 262 280   Dg Chest 2 View  12/23/2015  CLINICAL DATA:  Short of breath.  Pleural effusion. EXAM: CHEST  2 VIEW COMPARISON:  12/20/2015 FINDINGS: Large wedge-shaped opacity occupying the right mid and lower lung zone is worse. This is likely a combination of loculated pleural fluid and consolidation. No pneumothorax. Minimal basilar atelectasis. Normal heart size. IMPRESSION: Worsening large opacity in the right hemi thorax likely a combination of pleural fluid and consolidation. Electronically Signed   By: Jolaine Click M.D.   On: 12/23/2015 08:50   Ct Chest Wo Contrast  12/23/2015  CLINICAL DATA:  Pleural effusion. Recently treated for pneumonia. Large pleural opacity in the right chest on earlier radiographs. EXAM: CT CHEST WITHOUT  CONTRAST TECHNIQUE: Multidetector CT imaging of the chest was performed following the standard protocol without IV contrast. COMPARISON:  Chest radiographs earlier today.  Chest CT 12/21/2015. FINDINGS: Small mediastinal lymph nodes have slightly enlarged from the prior CT and are likely reactive, with right peritracheal lymph nodes measuring up to 7 mm in short axis. No  definite enlarged hilar lymph nodes are identified, with evaluation limited by lack of IV contrast. The heart is normal in size. Mild coronary artery calcified atherosclerosis is noted. Right pleural effusion has greatly increased in size from the prior CT and is now moderately large. The effusion is partially loculated and involves the major and minor fissures. There is overlying compressive atelectasis as well as areas of likely consolidation in the right middle and right lower lobes. Linear atelectasis or scarring is again seen in the left lower lobe. There is a trace left pleural effusion. Trace secretions in the trachea. A 1.4 cm calcified stone is present in the gallbladder. Benign-appearing 1.2 cm low-density lesion in the left hepatic lobe is unchanged. No acute osseous abnormality is identified. IMPRESSION: 1. Moderately large, partially loculated right pleural effusion which has greatly enlarged from 2 days ago and involves the major and minor fissures. 2. Associated compressive atelectasis as well as right middle and right lower lobe consolidation compatible with pneumonia. Electronically Signed   By: Sebastian AcheAllen  Grady M.D.   On: 12/23/2015 18:48   [place] Renal  12/22/2015  CLINICAL DATA:  Acute renal insufficiency.  Hypertension. EXAM: RENAL / URINARY TRACT ULTRASOUND COMPLETE COMPARISON:  None. FINDINGS: Right Kidney: Length: 11.7 cm. Mild increased cortical echogenicity. No mass or hydronephrosis visualized. Left Kidney: Length: 10.6 cm. Mild increased cortical echogenicity. Few small cysts all less than 2 cm in size. No hydronephrosis. Bladder: Appears normal for degree of bladder distention. IMPRESSION: Normal size kidneys without evidence of hydronephrosis. Mild increased cortical echogenicity which can be seen with medical renal disease. Three small left renal cysts. Electronically Signed   By: Elberta Fortisaniel  Boyle M.D.   On: 12/22/2015 11:00  PCXR: marked right sided opacification. Remarkably larger than  admit occupying about 2/3 right chest.  Bedside US: mod/lg effusion. Seems loculated under right scapula.   ASSESSMENT / PLAN: CAP(NOS) Complicated right pleural effusion (appears loculated by bedside US) w/ significant right sided atelectasis/lung collapse Repeat CT confirms above Plan Repeat CT chest (non-contrast)- full reviewed Diagnostic/therapeutic thoracentesis is required by CT guidance as location is under scapula and pocket is high risk PTX with bedside tap-  Will order likely will need VATS pending below decisions by pt Cont current abx Nebs, IS, flutter and mucinex  I am concerned that this may require surgical intervention. He states he "will not have a surgery". Not convinced he understands the potential consequences.  Dc azithro Low threshold change ceftriaxone to zosyn and addition vanc if spike or declines  Mcarthur Rossettianiel J. Tyson AliasFeinstein, MD, FACP Pgr: [phone] [facility]

## 2015-12-24 NOTE — H&P (Signed)
Chief Complaint: Empyema  Referring Physician(s): Rory Percyaniel Feinstein  Supervising Physician: Simonne ComeWatts, John  History of Present Illness: Ian Carter is a 65 y.o. male with history of hypertension, hyperlipidemia, schizophrenia presents to the ER because of persistent right-sided chest pain or last 4-5 days.   Patient states he has been having constant pleuritic type of right-sided chest pain. Has been having some cough.  He denies any recent sick contacts or travel.   In the ED he was found to be febrile with temperatures around 102F  and chest x-ray shows a right-sided infiltrate with effusion.  CT scan shows loculated pleural effusion.    We are asked to evaluate for CT guided pigtail catheter to drain effusion/empyema.  Patient needs a VATS however he is adamant that he does not want a surgical procedure.   Past Medical History  Diagnosis Date  . Schizophrenia (HCC)   . Anxiety   . Seizures (HCC)   . Hypertension   . Dyslipidemia     History reviewed. No pertinent past surgical history.  Allergies: Review of patient's allergies indicates no known allergies.  Medications: Prior to Admission medications   Medication Sig Start Date End Date Taking? Authorizing Provider  acetaminophen (TYLENOL) 500 MG tablet Take 500 mg by mouth every 8 (eight) hours as needed for mild pain, moderate pain or headache.   Yes Historical Provider, MD  amLODipine (NORVASC) 5 MG tablet Take 5 mg by mouth daily.   Yes Historical Provider, MD  aspirin 81 MG tablet Take 81 mg by mouth daily.   Yes Historical Provider, MD  benztropine (COGENTIN) 0.5 MG tablet Take 0.5 mg by mouth 2 (two) times daily.   Yes Historical Provider, MD  carbamazepine (TEGRETOL) 200 MG tablet Take 400 mg by mouth at bedtime.   Yes Historical Provider, MD  cetirizine (ZYRTEC) 10 MG tablet Take 10 mg by mouth at bedtime.   Yes Historical Provider, MD  cholecalciferol (VITAMIN D) 400 units TABS tablet Take 400 Units  by mouth daily.   Yes Historical Provider, MD  docusate sodium (COLACE) 100 MG capsule Take 100 mg by mouth 2 (two) times daily.   Yes Historical Provider, MD  haloperidol (HALDOL) 20 MG tablet Take 20 mg by mouth at bedtime.   Yes Historical Provider, MD  hydrochlorothiazide (MICROZIDE) 12.5 MG capsule Take 12.5 mg by mouth daily.   Yes Historical Provider, MD  meloxicam (MOBIC) 7.5 MG tablet Take 7.5 mg by mouth daily.   Yes Historical Provider, MD  simvastatin (ZOCOR) 10 MG tablet Take 10 mg by mouth at bedtime.   Yes Historical Provider, MD  Skin Protectants, Misc. (EUCERIN) cream Apply 1 application topically 2 (two) times daily.   Yes Historical Provider, MD     Family History  Problem Relation Age of Onset  . Diabetes Mellitus II Brother     Social History   Social History  . Marital Status: Single    Spouse Name: N/A  . Number of Children: N/A  . Years of Education: N/A   Social History Main Topics  . Smoking status: Heavy Tobacco Smoker -- 0.50 packs/day    Types: Cigars  . Smokeless tobacco: None  . Alcohol Use: No  . Drug Use: No  . Sexual Activity: Not Asked   Other Topics Concern  . None   Social History Narrative     Review of Systems: A 12 point ROS discussed  Review of Systems  Constitutional: Positive for fever, chills, activity change, appetite  change and fatigue.  HENT: Negative.   Respiratory: Positive for cough, shortness of breath and wheezing.   Cardiovascular: Positive for chest pain.  Gastrointestinal: Negative for nausea, vomiting, abdominal pain and abdominal distention.  Genitourinary: Negative.   Musculoskeletal: Negative.   Skin: Negative.   Neurological: Negative.   Psychiatric/Behavioral: Negative.     Vital Signs: BP 122/68 mmHg  Pulse 103  Temp(Src) 98.3 F (36.8 C) (Oral)  Resp 18  Ht 6' (1.829 m)  Wt 192 lb 11.2 oz (87.408 kg)  BMI 26.13 kg/m2  SpO2 94%  Physical Exam  Constitutional: He is oriented to person, place,  and time. He appears well-developed and well-nourished.  HENT:  Head: Normocephalic and atraumatic.  Eyes: EOM are normal.  Neck: Normal range of motion. Neck supple.  Cardiovascular: Normal rate, regular rhythm and normal heart sounds.   Pulmonary/Chest: Effort normal. He has wheezes.  Abdominal: Soft. Bowel sounds are normal.  Musculoskeletal: Normal range of motion.  Neurological: He is alert and oriented to person, place, and time.  Skin: Skin is warm and dry.  Psychiatric: He has a normal mood and affect. His behavior is normal. Judgment and thought content normal.  Vitals reviewed.   Mallampati Score:  MD Evaluation Airway: WNL Heart: WNL Abdomen: WNL Chest/ Lungs: Other (comments) Chest/ lungs comments: + Wheezes bilaterally ASA  Classification: 3 Mallampati/Airway Score: Two  Imaging: Dg Chest 2 View  12/23/2015  CLINICAL DATA:  Short of breath.  Pleural effusion. EXAM: CHEST  2 VIEW COMPARISON:  12/20/2015 FINDINGS: Large wedge-shaped opacity occupying the right mid and lower lung zone is worse. This is likely a combination of loculated pleural fluid and consolidation. No pneumothorax. Minimal basilar atelectasis. Normal heart size. IMPRESSION: Worsening large opacity in the right hemi thorax likely a combination of pleural fluid and consolidation. Electronically Signed   By: Jolaine Click M.D.   On: 12/23/2015 08:50   Dg Chest 2 View  12/20/2015  CLINICAL DATA:  Fever and right upper quadrant pain. EXAM: CHEST  2 VIEW COMPARISON:  None. FINDINGS: Airspace infiltration in the right lung base with small right pleural effusion. This may indicate pneumonia. Followup PA and lateral chest X-ray is recommended in 3-4 weeks following trial of antibiotic therapy to ensure resolution and exclude underlying malignancy. Left lung is clear. Normal heart size and pulmonary vascularity. No pneumothorax. Degenerative changes in the thoracic spine and shoulders. IMPRESSION: Right lung base  infiltration with small right pleural effusion likely representing pneumonia. Electronically Signed   By: Burman Nieves M.D.   On: 12/20/2015 23:31   Ct Chest Wo Contrast  12/23/2015  CLINICAL DATA:  Pleural effusion. Recently treated for pneumonia. Large pleural opacity in the right chest on earlier radiographs. EXAM: CT CHEST WITHOUT CONTRAST TECHNIQUE: Multidetector CT imaging of the chest was performed following the standard protocol without IV contrast. COMPARISON:  Chest radiographs earlier today.  Chest CT 12/21/2015. FINDINGS: Small mediastinal lymph nodes have slightly enlarged from the prior CT and are likely reactive, with right peritracheal lymph nodes measuring up to 7 mm in short axis. No definite enlarged hilar lymph nodes are identified, with evaluation limited by lack of IV contrast. The heart is normal in size. Mild coronary artery calcified atherosclerosis is noted. Right pleural effusion has greatly increased in size from the prior CT and is now moderately large. The effusion is partially loculated and involves the major and minor fissures. There is overlying compressive atelectasis as well as areas of likely consolidation in the  right middle and right lower lobes. Linear atelectasis or scarring is again seen in the left lower lobe. There is a trace left pleural effusion. Trace secretions in the trachea. A 1.4 cm calcified stone is present in the gallbladder. Benign-appearing 1.2 cm low-density lesion in the left hepatic lobe is unchanged. No acute osseous abnormality is identified. IMPRESSION: 1. Moderately large, partially loculated right pleural effusion which has greatly enlarged from 2 days ago and involves the major and minor fissures. 2. Associated compressive atelectasis as well as right middle and right lower lobe consolidation compatible with pneumonia. Electronically Signed   By: Sebastian Ache M.D.   On: 12/23/2015 18:48   Ct Chest Wo Contrast  12/21/2015  CLINICAL DATA:   65 year old male with chest pain and shortness of breath for 1 month. Initial encounter. EXAM: CT CHEST WITHOUT CONTRAST TECHNIQUE: Multidetector CT imaging of the chest was performed following the standard protocol without IV contrast. COMPARISON:  Chest radiographs 12/20/2015. FINDINGS: Moderate layering right pleural effusion, with a component of pleural fluid tracking into the major fissure. Small volume dependent retained secretions or debris in the trachea on series 5, image 12. Otherwise Major airways are patent. Consolidation in the right lower lobe adjacent to the pleural fluid. Atelectasis or less likely early consolidation in the inferior right middle lobe. Trace if any left pleural fluid. Linear atelectasis or scarring in the left lower lobe. No pericardial effusion. Suggestion of mild right hilar lymphadenopathy (series 2, image 34). Otherwise no thoracic lymphadenopathy. Negative noncontrast thoracic inlet. Mild calcified coronary artery atherosclerosis (series 2, image 34). Negative noncontrast visible spleen, pancreas, adrenal glands, kidneys, and bowel in the upper abdomen. Negative noncontrast liver; circumscribed 12 mm low-density area in the left lobe appears benign such as cyst. No acute osseous abnormality identified. IMPRESSION: 1. Right lower lobe consolidation compatible with pneumonia. Superimposed moderate size right pleural effusion, mostly layering but also tracking into the pleural fissures. Inferior right middle lobe opacity favored to be atelectasis. 2. Suggestion of reactive type right hilar lymphadenopathy. Small volume layering debris or secretions in the trachea. 3. No other acute findings in the noncontrast chest. Mild calcified coronary artery atherosclerosis. Followup PA and lateral chest X-ray is recommended in 3-4 weeks following trial of antibiotic therapy to ensure resolution and exclude underlying malignancy. Electronically Signed   By: Odessa Fleming M.D.   On: 12/21/2015 08:34     US Renal  12/22/2015  CLINICAL DATA:  Acute renal insufficiency.  Hypertension. EXAM: RENAL / URINARY TRACT ULTRASOUND COMPLETE COMPARISON:  None. FINDINGS: Right Kidney: Length: 11.7 cm. Mild increased cortical echogenicity. No mass or hydronephrosis visualized. Left Kidney: Length: 10.6 cm. Mild increased cortical echogenicity. Few small cysts all less than 2 cm in size. No hydronephrosis. Bladder: Appears normal for degree of bladder distention. IMPRESSION: Normal size kidneys without evidence of hydronephrosis. Mild increased cortical echogenicity which can be seen with medical renal disease. Three small left renal cysts. Electronically Signed   By: Elberta Fortis M.D.   On: 12/22/2015 11:00    Labs:  CBC:  Recent Labs  12/21/15 0342 12/22/15 0350 12/23/15 0352 12/24/15 0353  WBC 18.8* 27.0* 22.0* 17.8*  HGB 12.4* 12.1* 11.3* 11.4*  HCT 37.8* 34.8* 33.2* 33.1*  PLT 264 242 262 280    COAGS:  Recent Labs  12/23/15 2313  INR 1.28  APTT 28    BMP:  Recent Labs  12/21/15 0342 12/22/15 0350 12/23/15 0352 12/24/15 0353  NA 138 139 138 142  K  3.8 4.0 3.5 3.6  CL 101 104 107 107  CO2 GLUCOSE 112* 119* 126* 147*  BUN CALCIUM 8.6* 8.4* 8.2* 8.5*  CREATININE 1.42* 1.64* 1.31* 1.32*  GFRNONAA 51* 43* 56* 55*  GFRAA 59* 50* >60 >60    LIVER FUNCTION TESTS:  Recent Labs  12/20/15 2331 12/21/15 0342  BILITOT 0.9 0.7  AST 18 17  ALT 12* 12*  ALKPHOS 102 92  PROT 7.1 6.7  ALBUMIN 3.2* 3.0*    TUMOR MARKERS: No results for input(s): AFPTM, CEA, CA199, CHROMGRNA in the last 8760 hours.  Assessment and Plan:  Pneumonia with right loculated pleural effusion.  Will proceed with image guided placement of right chest pigtail catheter tomorrow.  Will hold lovenox and order NPO after MN.  Risks and Benefits discussed with the patient including bleeding, infection, damage to adjacent structures, pneumothorax, and sepsis.  All of the  patient's questions were answered, patient is agreeable to proceed. Consent signed and in chart.  Thank you for this interesting consult.  I greatly enjoyed meeting Cane Polansky and look forward to participating in their care.  A copy of this report was sent to the requesting provider on this date.  Electronically Signed: Gwynneth Macleod PA-C 12/24/2015, 2:21 PM   I spent a total of 40 Minutes in face to face in clinical consultation, greater than 50% of which was counseling/coordinating care for CT guided chest catheter.

## 2015-12-24 NOTE — Progress Notes (Signed)
PROGRESS NOTE    Ian Carter  ZOX:[id number]  DOB: August 03, 1951  DOA: 12/20/2015 PCP: No primary care provider on file. Outpatient Specialists:   Hospital course: 65 year old male, supposedly resident of a group home, history of schizophrenia, HTN, HLD, presented to ED with right-sided pleuritic type of chest pain, cough, some dyspnea. In the ED, febrile 102F and chest x-ray showed right-sided infiltrate with effusion. CT chest confirmed RLL PNA & moderate R sided pleural effusion. Admitted for community-acquired pneumonia. Pulmonology consulted for moderate size right pleural effusion recommend CT-guided thoracentesis.   Assessment & Plan:   RLL Community-acquired pneumonia with moderate pleural effusion (possibly parapneumonic) - Influenza panel PCR negative. Urine Legionella antigen: Pending. Urinary streptococcal antigen: Negative. HIV antibody: Nonreactive. - Treating empirically with IV Rocephin and azithromycin. - CT chest 3/22: Right lower lobe consolidation compatible with pneumonia, superimposed moderate-sized right pleural effusion. - will need follow-up chest x-ray in 3-4 weeks to ensure resolution of pneumonia findings. -  Blood cultures 2: negative to date. Urine culture times one: Negative. -  Patient did spike fever of  102.6 overnight 3/22 but hasn't had any further high fevers. If he does spike high fevers, low threshold to change IV ceftriaxone to Zosyn and add vancomycin. - Pulmonology was consulted for enlarging right pleural effusion. Noncontrasted CT chest ordered which shows complicated right pleural effusion (appeared loculated by bedside ultrasound) with significant right-sided atelectasis & consolidation. As per pulmonology follow-up 3/25: Recommend CT-guided diagnostic and therapeutic thoracentesis as location is high risk for pneumothorax with bedside. Patient may need VATS but patient currently declining. - Follow.  Essential hypertension - Controlled  on amlodipine. HCTZ held temporarily.  Hyperlipidemia - Continue statins.  Schizophrenia - Stable without complaints. Continue home medications-haloperidol and benztropine. Patient apparently is on carbamazepine at home for unclear indications.  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 Stable. -  Renal ultrasound without hydronephrosis.   Leukocytosis -  Most likely secondary to pneumonia. No diarrhea reported. Persisting.? Need to rule out empyema.  Anemia - Stable.    DVT prophylaxis: Lovenox Code Status: Full Family Communication: None at bedside Disposition Plan: DC home when medically stable.   Consultants:  Pulmonology  Procedures:  None  Antimicrobials:  Azithromycin 3/21 > 3/25  Rocephin 3/21 >   Subjective: Denies dyspnea, chest pain or cough. As per RN, no acute issues.  Objective: Filed Vitals:   12/23/15 1507 12/23/15 2217 12/24/15 0532 12/24/15 1254  BP: 119/74 131/74 126/68 122/68  Pulse: 104 103 93 103  Temp: 98.7 F (37.1 C) 99.8 F (37.7 C) 99.9 F (37.7 C) 98.3 F (36.8 C)  TempSrc: Oral Oral Oral Oral  Resp: Height:      Weight:      SpO2: 95% 95% 95% 94%    Intake/Output Summary (Last 24 hours) at 12/24/15 1338 Last data filed at 12/24/15 0800  Gross per 24 hour  Intake   1385 ml  Output      0 ml  Net   1385 ml   Filed Weights   12/20/15 2243 12/21/15 0140  Weight: 90.266 kg (199 lb) 87.408 kg (192 lb 11.2 oz)    Exam:  General exam: Pleasant middle-aged male lying comfortably propped up in bed. Does not look septic or toxic. Respiratory system: Decreased breath sounds in right base. Rest of lung fields clear to auscultation. No pleural rub heard. No increased work of breathing. Cardiovascular system: S1 & S2  heard, RRR. No JVD, murmurs, gallops, clicks or pedal edema. Gastrointestinal system: Abdomen is nondistended, soft and nontender. Normal bowel  sounds heard. Central nervous system: Alert and oriented. No focal neurological deficits. Extremities: Symmetric 5 x 5 power.   Data Reviewed: Basic Metabolic Panel:  Recent Labs Lab 12/20/15 2331 12/21/15 0342 12/22/15 0350 12/23/15 0352 12/24/15 0353  NA 137 138 139 138 142  K 3.8 3.8 4.0 3.5 3.6  CL 102 101 104 107 107  CO2 GLUCOSE 128* 112* 119* 126* 147*  BUN CREATININE 1.46* 1.42* 1.64* 1.31* 1.32*  CALCIUM 8.6* 8.6* 8.4* 8.2* 8.5*   Liver Function Tests:  Recent Labs Lab 12/20/15 2331 12/21/15 0342  AST 18 17  ALT 12* 12*  ALKPHOS 102 92  BILITOT 0.9 0.7  PROT 7.1 6.7  ALBUMIN 3.2* 3.0*    Recent Labs Lab 12/20/15 2331  LIPASE 35   No results for input(s): AMMONIA in the last 168 hours. CBC:  Recent Labs Lab 12/20/15 2256 12/21/15 0342 12/22/15 0350 12/23/15 0352 12/24/15 0353  WBC 18.9* 18.8* 27.0* 22.0* 17.8*  NEUTROABS 15.9* 15.7*  --   --   --   HGB 12.7* 12.4* 12.1* 11.3* 11.4*  HCT 38.8* 37.8* 34.8* 33.2* 33.1*  MCV 89.2 89.4 85.5 86.7 86.2  PLT 277 264 242 262 280   Cardiac Enzymes: No results for input(s): CKTOTAL, CKMB, CKMBINDEX, TROPONINI in the last 168 hours. BNP (last 3 results) No results for input(s): PROBNP in the last 8760 hours. CBG: No results for input(s): GLUCAP in the last 168 hours.  Recent Results (from the past 240 hour(s))  Culture, blood (routine x 2)     Status: None (Preliminary result)   Collection Time: 12/20/15 10:55 PM  Result Value Ref Range Status   Specimen Description BLOOD RIGHT ANTECUBITAL  Final   Special Requests BOTTLES DRAWN AEROBIC AND ANAEROBIC 5 ML  Final   Culture   Final    NO GROWTH 3 DAYS Performed at [facility]    Report Status PENDING  Incomplete  Culture, blood (routine x 2)     Status: None (Preliminary result)   Collection Time: 12/20/15 10:56 PM  Result Value Ref Range Status   Specimen Description BLOOD BLOOD LEFT FOREARM  Final    Special Requests BOTTLES DRAWN AEROBIC AND ANAEROBIC 5 ML  Final   Culture   Final    NO GROWTH 3 DAYS Performed at [facility]    Report Status PENDING  Incomplete  Urine culture     Status: None   Collection Time: 12/21/15  2:01 AM  Result Value Ref Range Status   Specimen Description URINE, RANDOM  Final   Special Requests NONE  Final   Culture   Final    NO GROWTH 1 DAY Performed at [facility]    Report Status 12/22/2015 FINAL  Final  Culture, sputum-assessment     Status: None   Collection Time: 12/23/15  6:37 AM  Result Value Ref Range Status   Specimen Description SPUTUM  Final   Special Requests NONE  Final   Sputum evaluation   Final    MICROSCOPIC FINDINGS SUGGEST THAT THIS SPECIMEN IS NOT REPRESENTATIVE OF LOWER RESPIRATORY SECRETIONS. PLEASE RECOLLECT. NOTIFIED RN AT [phone] ON 03.24.17 BY SHUEA    Report Status 12/23/2015 FINAL  Final         Studies: Dg Chest 2 View  12/23/2015  CLINICAL DATA:  Short of breath.  Pleural effusion. EXAM: CHEST  2 VIEW COMPARISON:  12/20/2015 FINDINGS: Large wedge-shaped opacity occupying the right mid and lower lung zone is worse. This is likely a combination of loculated pleural fluid and consolidation. No pneumothorax. Minimal basilar atelectasis. Normal heart size. IMPRESSION: Worsening large opacity in the right hemi thorax likely a combination of pleural fluid and consolidation. Electronically Signed   By: Jolaine ClickArthur  Hoss M.D.   On: 12/23/2015 08:50   Ct Chest Wo Contrast  12/23/2015  CLINICAL DATA:  Pleural effusion. Recently treated for pneumonia. Large pleural opacity in the right chest on earlier radiographs. EXAM: CT CHEST WITHOUT CONTRAST TECHNIQUE: Multidetector CT imaging of the chest was performed following the standard protocol without IV contrast. COMPARISON:  Chest radiographs earlier today.  Chest CT 12/21/2015. FINDINGS: Small mediastinal lymph nodes have slightly enlarged from the prior CT and are  likely reactive, with right peritracheal lymph nodes measuring up to 7 mm in short axis. No definite enlarged hilar lymph nodes are identified, with evaluation limited by lack of IV contrast. The heart is normal in size. Mild coronary artery calcified atherosclerosis is noted. Right pleural effusion has greatly increased in size from the prior CT and is now moderately large. The effusion is partially loculated and involves the major and minor fissures. There is overlying compressive atelectasis as well as areas of likely consolidation in the right middle and right lower lobes. Linear atelectasis or scarring is again seen in the left lower lobe. There is a trace left pleural effusion. Trace secretions in the trachea. A 1.4 cm calcified stone is present in the gallbladder. Benign-appearing 1.2 cm low-density lesion in the left hepatic lobe is unchanged. No acute osseous abnormality is identified. IMPRESSION: 1. Moderately large, partially loculated right pleural effusion which has greatly enlarged from 2 days ago and involves the major and minor fissures. 2. Associated compressive atelectasis as well as right middle and right lower lobe consolidation compatible with pneumonia. Electronically Signed   By: Sebastian AcheAllen  Grady M.D.   On: 12/23/2015 18:48        Scheduled Meds: . amLODipine  5 mg Oral Daily  . aspirin EC  81 mg Oral Daily  . benztropine  0.5 mg Oral BID  . carbamazepine  400 mg Oral QHS  . cefTRIAXone (ROCEPHIN)  IV  1 g Intravenous Q24H  . docusate sodium  100 mg Oral BID  . enoxaparin (LOVENOX) injection  40 mg Subcutaneous Q24H  . haloperidol  20 mg Oral QHS  . loratadine  10 mg Oral Daily  . simvastatin  10 mg Oral QHS   Continuous Infusions: . sodium chloride 50 mL/hr at 12/24/15 1143    Principal Problem:   CAP (community acquired pneumonia) Active Problems:   HTN (hypertension)   HLD (hyperlipidemia)   Pleural effusion    Time spent: 25 minutes.    Marcellus ScottHONGALGI,Aftan Vint, MD,  FACP, FHM. [facility] Pager [phone]  If 7PM-7AM, please contact night-coverage [email or web address] Password [email or web address] 12/24/2015, 1:38 PM    LOS: 3 days

## 2015-12-25 ENCOUNTER — Inpatient Hospital Stay (HOSPITAL_COMMUNITY): Payer: Medicare Other

## 2015-12-25 ENCOUNTER — Encounter (HOSPITAL_COMMUNITY): Payer: Self-pay | Admitting: Radiology

## 2015-12-25 MED ORDER — FENTANYL CITRATE (PF) 100 MCG/2ML IJ SOLN
INTRAMUSCULAR | Status: AC | PRN
  Administered 2015-12-25 (×3): 25 ug via INTRAVENOUS

## 2015-12-25 MED ORDER — MIDAZOLAM HCL 2 MG/2ML IJ SOLN
INTRAMUSCULAR | Status: AC
  Filled 2015-12-25: qty 6

## 2015-12-25 MED ORDER — MIDAZOLAM HCL 2 MG/2ML IJ SOLN
INTRAMUSCULAR | Status: AC | PRN
  Administered 2015-12-25 (×3): 0.5 mg via INTRAVENOUS

## 2015-12-25 MED ORDER — FENTANYL CITRATE (PF) 100 MCG/2ML IJ SOLN
INTRAMUSCULAR | Status: AC
  Filled 2015-12-25: qty 4

## 2015-12-25 NOTE — Procedures (Signed)
Technically successful CT guided placed of a 12 Fr drainage catheter placement into the right pleural space yielding 220 cc of serous fluid.   Chest drain connected to pleur-vac device. All aspirated samples sent to the laboratory for analysis.   EBL: None No immediate post procedural complications.   Katherina RightJay Gaberiel Youngblood, MD Pager #: [phone]

## 2015-12-25 NOTE — Sedation Documentation (Signed)
Patient denies pain and is resting comfortably.  

## 2015-12-25 NOTE — Progress Notes (Signed)
Patient back from IR, AAO. With right chest tube intact, connected to sahara and low wall suction, draining clear yellow fluid. Patient in no distress.

## 2015-12-25 NOTE — Progress Notes (Addendum)
MEDICATION RELATED CONSULT NOTE   IR Procedure Consult - Anticoagulant/Antiplatelet PTA/Inpatient Med List Review by Pharmacist    Procedure:   CT guided fluid drain placement into R pleural space for empyema/effusion Completed:  12/25/2015 10:13am Post-Procedural bleeding risk per IR MD assessment:  Not stated  Antithrombotic medications on inpatient or PTA profile prior to procedure:   Enoxaparin 40mg  SQ q24h    Recommended restart time per IR Post-Procedure Guidelines:   Appears procedure has "standard" bleeding risk   Other considerations:      Plan:      Resume enoxaparin 40mg  SQ q24h starting 3/27  Juliette Alcideustin Zeigler, PharmD, BCPS.   Pager: [phone] 12/25/2015 10:55 AM

## 2015-12-25 NOTE — Progress Notes (Signed)
Patient ID: Ian Carter, male   DOB: 1951/07/08, 65 y.o.   MRN: [id number]  Appreciate IR and agree with  plan for CT via imaging Will follow up Monday  Mcarthur Rossettianiel J. Tyson AliasFeinstein, MD, FACP Pgr: [phone] [facility]

## 2015-12-25 NOTE — Progress Notes (Signed)
PROGRESS NOTE    Eddie CandleRaynard Rish  ZOX:[id number]  DOB: 11-28-1950  DOA: 12/20/2015 PCP: No primary care provider on file. Outpatient Specialists:   Hospital course: 65 year old male, supposedly resident of a group home, history of schizophrenia, HTN, HLD, presented to ED with right-sided pleuritic type of chest pain, cough, some dyspnea. In the ED, febrile 102F and chest x-ray showed right-sided infiltrate with effusion. CT chest confirmed RLL PNA & moderate R sided pleural effusion. Admitted for community-acquired pneumonia. Pulmonology consulted for moderate size right pleural effusion recommend CT-guided thoracentesis. S/P CT-guided right thoracentesis by IR on 3/26.   Assessment & Plan:   RLL Community-acquired pneumonia with moderate pleural effusion (possibly parapneumonic) - Influenza panel PCR negative. Urine Legionella antigen: Pending. Urinary streptococcal antigen: Negative. HIV antibody: Nonreactive. - Treating empirically with IV Rocephin and azithromycin. - CT chest 3/22: Right lower lobe consolidation compatible with pneumonia, superimposed moderate-sized right pleural effusion. - will need follow-up chest x-ray in 3-4 weeks to ensure resolution of pneumonia findings. -  Blood cultures 2: negative to date. Urine culture times one: Negative. -  Patient did spike fever of  102.6 overnight 3/22 but hasn't had any further high fevers. If he does spike high fevers, low threshold to change IV ceftriaxone to Zosyn and add vancomycin. - Pulmonology was consulted for enlarging right pleural effusion. Noncontrasted CT chest ordered which shows complicated right pleural effusion (appeared loculated by bedside ultrasound) with significant right-sided atelectasis & consolidation.  - Pulmonology arranged and patient underwent CT-guided right thoracentesis and placement of drainage catheter by IR on 3/26, yielding 220 mL of serous fluid. Follow pleural fluid results and  clinically.  Essential hypertension - Controlled on amlodipine. HCTZ held temporarily.  Hyperlipidemia - Continue statins.  Schizophrenia - Stable without complaints. Continue home medications-haloperidol and benztropine. Patient apparently is on carbamazepine at home for unclear indications.  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 Stable. -  Renal ultrasound without hydronephrosis.   Leukocytosis -  Most likely secondary to pneumonia. No diarrhea reported. Persisting.? Need to rule out empyema.  Anemia - Stable.    DVT prophylaxis: Lovenox Code Status: Full Family Communication: None at bedside Disposition Plan: DC home when medically stable.   Consultants:  Pulmonology  IR  Procedures:  3/26: CT guided placed of a 12 Fr drainage catheter placement into the right pleural space yielding 220 cc of serous fluid.   Antimicrobials:  Azithromycin 3/21 > 3/25  Rocephin 3/21 >   Subjective: Seen post thoracentesis. Patient seems surprised that he had that much fluid in his chest. Denies dyspnea, chest pain or cough. As per RN, no acute issues.  Objective: Filed Vitals:   12/25/15 1014 12/25/15 1017 12/25/15 1040 12/25/15 1119  BP: 120/68 112/68 116/67 111/68  Pulse: 99 97 98 96  Temp:   98.3 F (36.8 C) 98.2 F (36.8 C)  TempSrc:   Oral Oral  Resp: 20 22 20 18   Height:      Weight:      SpO2: 95% 94% 92% 93%    Intake/Output Summary (Last 24 hours) at 12/25/15 1249 Last data filed at 12/24/15 1700  Gross per 24 hour  Intake    240 ml  Output      0 ml  Net    240 ml   Filed Weights   12/20/15 2243 12/21/15 0140  Weight: 90.266 kg (199 lb) 87.408 kg (192 lb 11.2 oz)    Exam:  General exam:  Pleasant middle-aged male lying comfortably propped up in bed.  Respiratory system: Improved breath sounds in right base. Rest of lung fields clear to auscultation. No pleural rub heard. No increased work  of breathing. New R chest 12 French drainage catheter connected to pleur-vac arise. Draining straw-colored fluid. Cardiovascular system: S1 & S2 heard, RRR. No JVD, murmurs, gallops, clicks or pedal edema. Gastrointestinal system: Abdomen is nondistended, soft and nontender. Normal bowel sounds heard. Central nervous system: Alert and oriented. No focal neurological deficits. Extremities: Symmetric 5 x 5 power.   Data Reviewed: Basic Metabolic Panel:  Recent Labs Lab 12/20/15 2331 12/21/15 0342 12/22/15 0350 12/23/15 0352 12/24/15 0353  NA 137 138 139 138 142  K 3.8 3.8 4.0 3.5 3.6  CL 102 101 104 107 107  CO2 GLUCOSE 128* 112* 119* 126* 147*  BUN CREATININE 1.46* 1.42* 1.64* 1.31* 1.32*  CALCIUM 8.6* 8.6* 8.4* 8.2* 8.5*   Liver Function Tests:  Recent Labs Lab 12/20/15 2331 12/21/15 0342  AST 18 17  ALT 12* 12*  ALKPHOS 102 92  BILITOT 0.9 0.7  PROT 7.1 6.7  ALBUMIN 3.2* 3.0*    Recent Labs Lab 12/20/15 2331  LIPASE 35   No results for input(s): AMMONIA in the last 168 hours. CBC:  Recent Labs Lab 12/20/15 2256 12/21/15 0342 12/22/15 0350 12/23/15 0352 12/24/15 0353  WBC 18.9* 18.8* 27.0* 22.0* 17.8*  NEUTROABS 15.9* 15.7*  --   --   --   HGB 12.7* 12.4* 12.1* 11.3* 11.4*  HCT 38.8* 37.8* 34.8* 33.2* 33.1*  MCV 89.2 89.4 85.5 86.7 86.2  PLT 277 264 242 262 280   Cardiac Enzymes: No results for input(s): CKTOTAL, CKMB, CKMBINDEX, TROPONINI in the last 168 hours. BNP (last 3 results) No results for input(s): PROBNP in the last 8760 hours. CBG: No results for input(s): GLUCAP in the last 168 hours.  Recent Results (from the past 240 hour(s))  Culture, blood (routine x 2)     Status: None (Preliminary result)   Collection Time: 12/20/15 10:55 PM  Result Value Ref Range Status   Specimen Description BLOOD RIGHT ANTECUBITAL  Final   Special Requests BOTTLES DRAWN AEROBIC AND ANAEROBIC 5 ML  Final   Culture   Final     NO GROWTH 3 DAYS Performed at [facility]    Report Status PENDING  Incomplete  Culture, blood (routine x 2)     Status: None (Preliminary result)   Collection Time: 12/20/15 10:56 PM  Result Value Ref Range Status   Specimen Description BLOOD BLOOD LEFT FOREARM  Final   Special Requests BOTTLES DRAWN AEROBIC AND ANAEROBIC 5 ML  Final   Culture   Final    NO GROWTH 3 DAYS Performed at [facility]    Report Status PENDING  Incomplete  Urine culture     Status: None   Collection Time: 12/21/15  2:01 AM  Result Value Ref Range Status   Specimen Description URINE, RANDOM  Final   Special Requests NONE  Final   Culture   Final    NO GROWTH 1 DAY Performed at [facility]    Report Status 12/22/2015 FINAL  Final  Culture, sputum-assessment     Status: None   Collection Time: 12/23/15  6:37 AM  Result Value Ref Range Status   Specimen Description SPUTUM  Final   Special Requests NONE  Final   Sputum evaluation  Final    MICROSCOPIC FINDINGS SUGGEST THAT THIS SPECIMEN IS NOT REPRESENTATIVE OF LOWER RESPIRATORY SECRETIONS. PLEASE RECOLLECT. NOTIFIED RN AT [phone] ON 03.24.17 BY SHUEA    Report Status 12/23/2015 FINAL  Final         Studies: Ct Chest Wo Contrast  12/23/2015  CLINICAL DATA:  Pleural effusion. Recently treated for pneumonia. Large pleural opacity in the right chest on earlier radiographs. EXAM: CT CHEST WITHOUT CONTRAST TECHNIQUE: Multidetector CT imaging of the chest was performed following the standard protocol without IV contrast. COMPARISON:  Chest radiographs earlier today.  Chest CT 12/21/2015. FINDINGS: Small mediastinal lymph nodes have slightly enlarged from the prior CT and are likely reactive, with right peritracheal lymph nodes measuring up to 7 mm in short axis. No definite enlarged hilar lymph nodes are identified, with evaluation limited by lack of IV contrast. The heart is normal in size. Mild coronary artery calcified  atherosclerosis is noted. Right pleural effusion has greatly increased in size from the prior CT and is now moderately large. The effusion is partially loculated and involves the major and minor fissures. There is overlying compressive atelectasis as well as areas of likely consolidation in the right middle and right lower lobes. Linear atelectasis or scarring is again seen in the left lower lobe. There is a trace left pleural effusion. Trace secretions in the trachea. A 1.4 cm calcified stone is present in the gallbladder. Benign-appearing 1.2 cm low-density lesion in the left hepatic lobe is unchanged. No acute osseous abnormality is identified. IMPRESSION: 1. Moderately large, partially loculated right pleural effusion which has greatly enlarged from 2 days ago and involves the major and minor fissures. 2. Associated compressive atelectasis as well as right middle and right lower lobe consolidation compatible with pneumonia. Electronically Signed   By: Sebastian Ache M.D.   On: 12/23/2015 18:48   Ct Image Guided Fluid Drain By Catheter  12/25/2015  INDICATION: Admitted with pneumonia, now with complex partially loculated right-sided pleural effusion. Please perform CT-guided percutaneous drainage catheter placement. EXAM: CT IMAGE GUIDED FLUID DRAIN BY CATHETER COMPARISON:  Chest CT - 12/23/2015 MEDICATIONS: The patient is currently admitted to the hospital and receiving intravenous antibiotics. The antibiotics were administered within an appropriate time frame prior to the initiation of the procedure. ANESTHESIA/SEDATION: Moderate (conscious) sedation was employed during this procedure. A total of Versed 1.5 mg and Fentanyl 75 mcg was administered intravenously. Moderate Sedation Time: 19 minutes. The patient's level of consciousness and vital signs were monitored continuously by radiology nursing throughout the procedure under my direct supervision. CONTRAST:  None COMPLICATIONS: None immediate. PROCEDURE:  Informed written consent was obtained from the patient after a discussion of the risks, benefits and alternatives to treatment. The patient was placed supine, slightly LPO on the CT gantry and a pre procedural CT was performed re-demonstrating the known moderate to large size partially loculated right-sided pleural effusion. The procedure was planned. A timeout was performed prior to the initiation of the procedure. The skin overlying the lateral aspect of the right chest wall was prepped and draped in the usual sterile fashion. The overlying soft tissues were anesthetized with 1% lidocaine with epinephrine. Appropriate trajectory was planned with the use of a 22 gauge spinal needle. An 18 gauge trocar needle was advanced into the abscess/fluid collection and a short Amplatz super stiff wire was coiled within the collection. Appropriate positioning was confirmed with a limited CT scan. The tract was serially dilated allowing placement of a 12 [place] all-purpose drainage  catheter. Appropriate positioning was confirmed with a limited postprocedural CT scan. Following percutaneous drainage catheter placement, approximately 220 cc of serous, non foul smelling fluid was aspirated. The tube was connected to a pleural vac device and sutured in place. A dressing was placed. The patient tolerated the procedure well without immediate post procedural complication. IMPRESSION: Successful CT guided placement of a 66 French all purpose drain catheter into the right pleural space with aspiration of approximately 220 cc of serous, non foul smelling mL of pleural fluid. Samples were sent to the laboratory as requested by the ordering clinical team. Electronically Signed   By: Simonne Come M.D.   On: 12/25/2015 11:40        Scheduled Meds: . amLODipine  5 mg Oral Daily  . aspirin EC  81 mg Oral Daily  . benztropine  0.5 mg Oral BID  . carbamazepine  400 mg Oral QHS  . cefTRIAXone (ROCEPHIN)  IV  1 g Intravenous Q24H  .  docusate sodium  100 mg Oral BID  . enoxaparin (LOVENOX) injection  40 mg Subcutaneous Q24H  . haloperidol  20 mg Oral QHS  . loratadine  10 mg Oral Daily  . simvastatin  10 mg Oral QHS   Continuous Infusions:    Principal Problem:   CAP (community acquired pneumonia) Active Problems:   HTN (hypertension)   HLD (hyperlipidemia)   Pleural effusion   AKI (acute kidney injury) (HCC)   Loculated pleural effusion    Time spent: 25 minutes.    Marcellus Scott, MD, FACP, FHM. [facility] Pager [phone]  If 7PM-7AM, please contact night-coverage [email or web address] Password [email or web address] 12/25/2015, 12:49 PM    LOS: 4 days

## 2015-12-26 DIAGNOSIS — J90 Pleural effusion, not elsewhere classified: Secondary | ICD-10-CM

## 2015-12-26 LAB — CBC
HEMATOCRIT: 33.5 % — AB (ref 39.0–52.0)
HEMOGLOBIN: 11.5 g/dL — AB (ref 13.0–17.0)
MCH: 29.3 pg (ref 26.0–34.0)
MCHC: 34.3 g/dL (ref 30.0–36.0)
MCV: 85.2 fL (ref 78.0–100.0)
Platelets: 321 10*3/uL (ref 150–400)
RBC: 3.93 MIL/uL — AB (ref 4.22–5.81)
RDW: 12.6 % (ref 11.5–15.5)
WBC: 13.9 10*3/uL — ABNORMAL HIGH (ref 4.0–10.5)

## 2015-12-26 LAB — CULTURE, BLOOD (ROUTINE X 2)
Culture: NO GROWTH
Culture: NO GROWTH

## 2015-12-26 NOTE — Progress Notes (Signed)
PROGRESS NOTE    Ian Carter  ZOX:[id number]  DOB: Jan 01, 1951  DOA: 12/20/2015 PCP: No primary care provider on file. Outpatient Specialists:   Hospital course: 65 year old male, supposedly resident of a group home, history of schizophrenia, HTN, HLD, presented to ED with right-sided pleuritic type of chest pain, cough, some dyspnea. In the ED, febrile 102F and chest x-ray showed right-sided infiltrate with effusion. CT chest confirmed RLL PNA & moderate R sided pleural effusion. Admitted for community-acquired pneumonia. Pulmonology consulted for moderate size right pleural effusion recommend CT-guided thoracentesis. S/P CT-guided right thoracentesis by IR on 3/26.   Assessment & Plan:   RLL Community-acquired pneumonia with complicated pleural effusion (possibly parapneumonic) - Influenza panel PCR negative. Urine Legionella antigen: Pending. Urinary streptococcal antigen: Negative. HIV antibody: Nonreactive. - Treating empirically with IV Rocephin and azithromycin. - CT chest 3/22: Right lower lobe consolidation compatible with pneumonia, superimposed moderate-sized right pleural effusion. - will need follow-up chest x-ray in 3-4 weeks to ensure resolution of pneumonia findings. -  Blood cultures 2: negative to date. Urine culture times one: Negative. -  Patient did spike fever of  102.6 overnight 3/22 but hasn't had any further high fevers. If he does spike high fevers, low threshold to change IV ceftriaxone to Zosyn and add vancomycin. - Pulmonology was consulted for enlarging right pleural effusion. Noncontrasted CT chest ordered which shows complicated right pleural effusion (appeared loculated by bedside ultrasound) with significant right-sided atelectasis & consolidation.  - Pulmonology arranged and patient underwent CT-guided right thoracentesis and placement of drainage catheter by IR on 3/26, yielding 220 mL of serous fluid. Follow pleural fluid results and  clinically. - As per pulmonology follow-up, despite CT-guided drainage and catheter placement, patient has significant loculation and feels that he needs VATS but patient hesitant for surgery. TCTS consulted by pulmonology. Await input.  Essential hypertension - Controlled on amlodipine. HCTZ held temporarily.  Hyperlipidemia - Continue statins.  Schizophrenia - Stable without complaints. Continue home medications-haloperidol and benztropine. Patient apparently is on carbamazepine at home for unclear indications.  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 Stable. -  Renal ultrasound without hydronephrosis.   Leukocytosis -  Most likely secondary to pneumonia. No diarrhea reported. Persisting.? Need to rule out empyema.  Anemia - Stable.    DVT prophylaxis: Lovenox Code Status: Full Family Communication: None at bedside Disposition Plan: DC home when medically stable.   Consultants:  Pulmonology  IR  Procedures:  3/26: CT guided placed of a 12 Fr drainage catheter placement into the right pleural space yielding 220 cc of serous fluid.   Antimicrobials:  Azithromycin 3/21 > 3/25  Rocephin 3/21 >   Subjective: Mild chest wall soreness at site of chest tube. No dyspnea reported. Denies any other complaints.  Objective: Filed Vitals:   12/25/15 2156 12/26/15 0547 12/26/15 0943 12/26/15 1441  BP: 112/59 122/73  119/64  Pulse: 91 91  87  Temp: 99.7 F (37.6 C) 99.9 F (37.7 C) 98.1 F (36.7 C) 98.8 F (37.1 C)  TempSrc: Oral Oral Oral Oral  Resp: 20 18  20   Height:      Weight:      SpO2: 95% 90% 97% 96%    Intake/Output Summary (Last 24 hours) at 12/26/15 1743 Last data filed at 12/26/15 0547  Gross per 24 hour  Intake    120 ml  Output    930 ml  Net   -810 ml   [employer]  12/20/15 2243 12/21/15 0140  Weight: 90.266 kg (199 lb) 87.408 kg (192 lb 11.2 oz)    Exam:  General exam:  Pleasant middle-aged male lying comfortably propped up in bed.  Respiratory system: Improved breath sounds in right base. Rest of lung fields clear to auscultation. No pleural rub heard. No increased work of breathing. New R chest 12 French drainage catheter connected to pleur-vac arise. Draining straw-colored fluid. Cardiovascular system: S1 & S2 heard, RRR. No JVD, murmurs, gallops, clicks or pedal edema. Gastrointestinal system: Abdomen is nondistended, soft and nontender. Normal bowel sounds heard. Central nervous system: Alert and oriented. No focal neurological deficits. Extremities: Symmetric 5 x 5 power.   Data Reviewed: Basic Metabolic Panel:  Recent Labs Lab 12/20/15 2331 12/21/15 0342 12/22/15 0350 12/23/15 0352 12/24/15 0353  NA 137 138 139 138 142  K 3.8 3.8 4.0 3.5 3.6  CL 102 101 104 107 107  CO2 GLUCOSE 128* 112* 119* 126* 147*  BUN CREATININE 1.46* 1.42* 1.64* 1.31* 1.32*  CALCIUM 8.6* 8.6* 8.4* 8.2* 8.5*   Liver Function Tests:  Recent Labs Lab 12/20/15 2331 12/21/15 0342  AST 18 17  ALT 12* 12*  ALKPHOS 102 92  BILITOT 0.9 0.7  PROT 7.1 6.7  ALBUMIN 3.2* 3.0*    Recent Labs Lab 12/20/15 2331  LIPASE 35   No results for input(s): AMMONIA in the last 168 hours. CBC:  Recent Labs Lab 12/20/15 2256 12/21/15 0342 12/22/15 0350 12/23/15 0352 12/24/15 0353 12/26/15 0359  WBC 18.9* 18.8* 27.0* 22.0* 17.8* 13.9*  NEUTROABS 15.9* 15.7*  --   --   --   --   HGB 12.7* 12.4* 12.1* 11.3* 11.4* 11.5*  HCT 38.8* 37.8* 34.8* 33.2* 33.1* 33.5*  MCV 89.2 89.4 85.5 86.7 86.2 85.2  PLT 277 264 242 262 280 321   Cardiac Enzymes: No results for input(s): CKTOTAL, CKMB, CKMBINDEX, TROPONINI in the last 168 hours. BNP (last 3 results) No results for input(s): PROBNP in the last 8760 hours. CBG: No results for input(s): GLUCAP in the last 168 hours.  Recent Results (from the past 240 hour(s))  Culture, blood (routine x  2)     Status: None   Collection Time: 12/20/15 10:55 PM  Result Value Ref Range Status   Specimen Description BLOOD RIGHT ANTECUBITAL  Final   Special Requests BOTTLES DRAWN AEROBIC AND ANAEROBIC 5 ML  Final   Culture   Final    NO GROWTH 5 DAYS Performed at [facility]    Report Status 12/26/2015 FINAL  Final  Culture, blood (routine x 2)     Status: None   Collection Time: 12/20/15 10:56 PM  Result Value Ref Range Status   Specimen Description BLOOD BLOOD LEFT FOREARM  Final   Special Requests BOTTLES DRAWN AEROBIC AND ANAEROBIC 5 ML  Final   Culture   Final    NO GROWTH 5 DAYS Performed at [facility]    Report Status 12/26/2015 FINAL  Final  Urine culture     Status: None   Collection Time: 12/21/15  2:01 AM  Result Value Ref Range Status   Specimen Description URINE, RANDOM  Final   Special Requests NONE  Final   Culture   Final    NO GROWTH 1 DAY Performed at [facility]    Report Status 12/22/2015 FINAL  Final  Culture, sputum-assessment     Status: None  Collection Time: 12/23/15  6:37 AM  Result Value Ref Range Status   Specimen Description SPUTUM  Final   Special Requests NONE  Final   Sputum evaluation   Final    MICROSCOPIC FINDINGS SUGGEST THAT THIS SPECIMEN IS NOT REPRESENTATIVE OF LOWER RESPIRATORY SECRETIONS. PLEASE RECOLLECT. NOTIFIED RN AT [phone] ON 03.24.17 BY SHUEA    Report Status 12/23/2015 FINAL  Final  Body fluid culture     Status: None (Preliminary result)   Collection Time: 12/25/15 10:14 AM  Result Value Ref Range Status   Specimen Description PLEURAL DRAIN PLACEMENT  Final   Special Requests NONE  Final   Gram Stain   Final    CYTOSPIN SLIDE WBC PRESENT, PREDOMINANTLY PMN NO ORGANISMS SEEN    Culture   Final    NO GROWTH < 24 HOURS [facility]    Report Status PENDING  Incomplete         Studies: Dg Chest 1 View  12/25/2015  CLINICAL DATA:  RIGHT chest tube placement. EXAM: CHEST  1 VIEW COMPARISON:  CT 12/25/2015.  Also CT 12/23/2015. FINDINGS: Pigtail catheter has been placed within a large RIGHT chest empyema. No pneumothorax. The collection appears decreased in comparison with prior imaging studies. IMPRESSION: Satisfactory post chest tube placement. Electronically Signed   By: Elsie StainJohn T Curnes M.D.   On: 12/25/2015 14:06   Ct Image Guided Fluid Drain By Catheter  12/25/2015  INDICATION: Admitted with pneumonia, now with complex partially loculated right-sided pleural effusion. Please perform CT-guided percutaneous drainage catheter placement. EXAM: CT IMAGE GUIDED FLUID DRAIN BY CATHETER COMPARISON:  Chest CT - 12/23/2015 MEDICATIONS: The patient is currently admitted to the hospital and receiving intravenous antibiotics. The antibiotics were administered within an appropriate time frame prior to the initiation of the procedure. ANESTHESIA/SEDATION: Moderate (conscious) sedation was employed during this procedure. A total of Versed 1.5 mg and Fentanyl 75 mcg was administered intravenously. Moderate Sedation Time: 19 minutes. The patient's level of consciousness and vital signs were monitored continuously by radiology nursing throughout the procedure under my direct supervision. CONTRAST:  None COMPLICATIONS: None immediate. PROCEDURE: Informed written consent was obtained from the patient after a discussion of the risks, benefits and alternatives to treatment. The patient was placed supine, slightly LPO on the CT gantry and a pre procedural CT was performed re-demonstrating the known moderate to large size partially loculated right-sided pleural effusion. The procedure was planned. A timeout was performed prior to the initiation of the procedure. The skin overlying the lateral aspect of the right chest wall was prepped and draped in the usual sterile fashion. The overlying soft tissues were anesthetized with 1% lidocaine with epinephrine. Appropriate trajectory was planned with the use  of a 22 gauge spinal needle. An 18 gauge trocar needle was advanced into the abscess/fluid collection and a short Amplatz super stiff wire was coiled within the collection. Appropriate positioning was confirmed with a limited CT scan. The tract was serially dilated allowing placement of a 12 [place] all-purpose drainage catheter. Appropriate positioning was confirmed with a limited postprocedural CT scan. Following percutaneous drainage catheter placement, approximately 220 cc of serous, non foul smelling fluid was aspirated. The tube was connected to a pleural vac device and sutured in place. A dressing was placed. The patient tolerated the procedure well without immediate post procedural complication. IMPRESSION: Successful CT guided placement of a 4512 French all purpose drain catheter into the right pleural space with aspiration of approximately 220 cc of serous,  non foul smelling mL of pleural fluid. Samples were sent to the laboratory as requested by the ordering clinical team. Electronically Signed   By: Simonne Come M.D.   On: 12/25/2015 11:40        Scheduled Meds: . amLODipine  5 mg Oral Daily  . aspirin EC  81 mg Oral Daily  . benztropine  0.5 mg Oral BID  . carbamazepine  400 mg Oral QHS  . cefTRIAXone (ROCEPHIN)  IV  1 g Intravenous Q24H  . docusate sodium  100 mg Oral BID  . enoxaparin (LOVENOX) injection  40 mg Subcutaneous Q24H  . haloperidol  20 mg Oral QHS  . loratadine  10 mg Oral Daily  . simvastatin  10 mg Oral QHS   Continuous Infusions:    Principal Problem:   CAP (community acquired pneumonia) Active Problems:   HTN (hypertension)   HLD (hyperlipidemia)   Pleural effusion   AKI (acute kidney injury) (HCC)   Loculated pleural effusion    Time spent: 25 minutes.    Marcellus Scott, MD, FACP, FHM. [facility] Pager [phone]  If 7PM-7AM, please contact night-coverage [email or web address] Password [email or web address] 12/26/2015, 5:43 PM    LOS: 5 days

## 2015-12-26 NOTE — Care Management Important Message (Signed)
Important Message  Patient Details  Name: Ian Carter MRN: [id number] Date of Birth: 11-05-1950   Medicare Important Message Given:  Yes    Haskell FlirtJamison, Jazlin Tapscott 12/26/2015, 12:17 PMImportant Message  Patient Details  Name: Ian Carter MRN: [id number] Date of Birth: 11-05-1950   Medicare Important Message Given:  Yes    Haskell FlirtJamison, Shakya Sebring 12/26/2015, 12:16 PM

## 2015-12-26 NOTE — Consult Note (Signed)
Reason for Consult:Complex right pleural effusion Referring Physician: Dr. Ernestina Carter/ Ian Carter is an 65 y.o. male.  HPI:  65 yo man with a history of hypertension, hyperlipidemia, seizures and schizophrenia. He was admitted 3/21 after presenting with a one week history of right sided chest pain. Pain initially with cough, deep breath, but constant by the time of admission. He was febrile with a temp of 102. His CXR showed a right pneumonia with a pleural effusion. He was admitted and started on antibioitcs with ceftriaxone and zithromax. He defervesed but his effusion worsened. A percutaneous drain was placed over the weekend and has drained about 500 ml but there was little change in his CXR.   He says he is having some pain at the CT site but otherwise feels well.  He is a poor historian.  Past Medical History  Diagnosis Date  . Schizophrenia (HCC)   . Anxiety   . Seizures (HCC)   . Hypertension   . Dyslipidemia     History reviewed. No pertinent past surgical history.  Family History  Problem Relation Age of Onset  . Diabetes Mellitus II Brother     Social History:  reports that he has been smoking Cigars.  He does not have any smokeless tobacco history on file. He reports that he does not drink alcohol or use illicit drugs.  Allergies: No Known Allergies  Medications:  Scheduled: . amLODipine  5 mg Oral Daily  . aspirin EC  81 mg Oral Daily  . benztropine  0.5 mg Oral BID  . carbamazepine  400 mg Oral QHS  . cefTRIAXone (ROCEPHIN)  IV  1 g Intravenous Q24H  . docusate sodium  100 mg Oral BID  . enoxaparin (LOVENOX) injection  40 mg Subcutaneous Q24H  . haloperidol  20 mg Oral QHS  . loratadine  10 mg Oral Daily  . simvastatin  10 mg Oral QHS    Results for orders placed or performed during the hospital encounter of 12/20/15 (from the past 48 hour(s))  Body fluid culture     Status: None (Preliminary result)   Collection Time: 12/25/15 10:14 AM  Result  Value Ref Range   Specimen Description PLEURAL DRAIN PLACEMENT    Special Requests NONE    Gram Stain      CYTOSPIN SLIDE WBC PRESENT, PREDOMINANTLY PMN NO ORGANISMS SEEN    Culture      NO GROWTH < 24 HOURS Performed at [facility]    Report Status PENDING   CBC     Status: Abnormal   Collection Time: 12/26/15  3:59 AM  Result Value Ref Range   WBC 13.9 (H) 4.0 - 10.5 K/uL   RBC 3.93 (L) 4.22 - 5.81 MIL/uL   Hemoglobin 11.5 (L) 13.0 - 17.0 g/dL   HCT 16.133.5 (L) 09.639.0 - 04.552.0 %   MCV 85.2 78.0 - 100.0 fL   MCH 29.3 26.0 - 34.0 pg   MCHC 34.3 30.0 - 36.0 g/dL   RDW 40.912.6 81.111.5 - 91.415.5 %   Platelets 321 150 - 400 K/uL    Dg Chest 1 View  12/25/2015  CLINICAL DATA:  RIGHT chest tube placement. EXAM: CHEST 1 VIEW COMPARISON:  CT 12/25/2015.  Also CT 12/23/2015. FINDINGS: Pigtail catheter has been placed within a large RIGHT chest empyema. No pneumothorax. The collection appears decreased in comparison with prior imaging studies. IMPRESSION: Satisfactory post chest tube placement. Electronically Signed   By: Elsie StainJohn T Curnes M.D.   On:  12/25/2015 14:06   Ct Image Guided Fluid Drain By Catheter  12/25/2015  INDICATION: Admitted with pneumonia, now with complex partially loculated right-sided pleural effusion. Please perform CT-guided percutaneous drainage catheter placement. EXAM: CT IMAGE GUIDED FLUID DRAIN BY CATHETER COMPARISON:  Chest CT - 12/23/2015 MEDICATIONS: The patient is currently admitted to the hospital and receiving intravenous antibiotics. The antibiotics were administered within an appropriate time frame prior to the initiation of the procedure. ANESTHESIA/SEDATION: Moderate (conscious) sedation was employed during this procedure. A total of Versed 1.5 mg and Fentanyl 75 mcg was administered intravenously. Moderate Sedation Time: 19 minutes. The patient's level of consciousness and vital signs were monitored continuously by radiology nursing throughout the procedure under my  direct supervision. CONTRAST:  None COMPLICATIONS: None immediate. PROCEDURE: Informed written consent was obtained from the patient after a discussion of the risks, benefits and alternatives to treatment. The patient was placed supine, slightly LPO on the CT gantry and a pre procedural CT was performed re-demonstrating the known moderate to large size partially loculated right-sided pleural effusion. The procedure was planned. A timeout was performed prior to the initiation of the procedure. The skin overlying the lateral aspect of the right chest wall was prepped and draped in the usual sterile fashion. The overlying soft tissues were anesthetized with 1% lidocaine with epinephrine. Appropriate trajectory was planned with the use of a 22 gauge spinal needle. An 18 gauge trocar needle was advanced into the abscess/fluid collection and a short Amplatz super stiff wire was coiled within the collection. Appropriate positioning was confirmed with a limited CT scan. The tract was serially dilated allowing placement of a 12 [place] all-purpose drainage catheter. Appropriate positioning was confirmed with a limited postprocedural CT scan. Following percutaneous drainage catheter placement, approximately 220 cc of serous, non foul smelling fluid was aspirated. The tube was connected to a pleural vac device and sutured in place. A dressing was placed. The patient tolerated the procedure well without immediate post procedural complication. IMPRESSION: Successful CT guided placement of a 71 French all purpose drain catheter into the right pleural space with aspiration of approximately 220 cc of serous, non foul smelling mL of pleural fluid. Samples were sent to the laboratory as requested by the ordering clinical team. Electronically Signed   By: Simonne Come M.D.   On: 12/25/2015 11:40    Review of Systems  Constitutional: Positive for fever and malaise/fatigue.  Respiratory: Positive for cough, sputum production and  shortness of breath. Negative for hemoptysis.   Cardiovascular: Positive for chest pain (right side pleuritic).  Neurological: Positive for seizures (none recently). Negative for loss of consciousness.  All other systems reviewed and are negative.  Blood pressure 119/64, pulse 87, temperature 98.8 F (37.1 C), temperature source Oral, resp. rate 20, height 6' (1.829 m), weight 192 lb 11.2 oz (87.408 kg), SpO2 96 %. Physical Exam  Vitals reviewed. Constitutional: He appears well-developed and well-nourished. No distress.  HENT:  Head: Normocephalic and atraumatic.  Eyes: Conjunctivae and EOM are normal. No scleral icterus.  Neck: Neck supple. No thyromegaly present.  Cardiovascular: Normal rate, regular rhythm and normal heart sounds.   No murmur heard. Respiratory: Effort normal. No respiratory distress. He has wheezes (faint).  Diminished BS right base Drain in place with serous drainage  GI: Soft. There is no tenderness.  Musculoskeletal: He exhibits no edema.  Lymphadenopathy:    He has no cervical adenopathy.  Neurological: He is alert.  Has trouble staying on subject. No focal motor deficit  Skin: Skin is warm and dry.  Psychiatric:  calm    Assessment/Plan: 65 yo man with schizophrenia with a right lower lobe pneumonia complicated by a para-pneumonic effusion, possible empyema. The fluid currently draining is not purulent, but he does have a loculated effusion so can't rule out purulent fluid elsewhere.  I agree with Dr. Delton Coombes that a right VATS to drain the effusion, and decorticate the lung if necessary is the best option for Mr. Seaman. He seems open to the idea currently after expressing reluctance previously.  I will attempt to contact his brother who is the decision maker tomorrow. If he agrees will plan to proceed with VATS on Wednesday 3/29.  Loreli Slot 12/26/2015, 7:15 PM

## 2015-12-26 NOTE — Progress Notes (Addendum)
Name: Ian Carter MRN: [id number] DOB: 1951/02/09    ADMISSION DATE:  12/20/2015 CONSULTATION DATE:  3/24  REFERRING MD :  Waymon Amato   CHIEF COMPLAINT:  Pneumonia and effusion   BRIEF PATIENT DESCRIPTION:  65 year old male admitted 3/22 w/ working dx of CAP (NOS). Treated w/ appropriate abx. Clinically felt better but CXR on 3/24 showed remarkably increased opacification occupying more than 2/3 the left hemi-thorax. PCCM was asked to see re: these CXR findings.   SIGNIFICANT EVENTS  Right CT guided CT 3/26>>>  STUDIES:  CT chest 3/22: 1. Right lower lobe consolidation compatible with pneumonia Superimposed moderate size right pleural effusion, mostly layering but also tracking into the pleural fissures. Inferior right middle lobe opacity favored to be atelectasis. 2. Suggestion of reactive type right hilar lymphadenopathy. Small volume layering debris or secretions in the trachea.  SUBJECTIVE:  No distress, still does not understand the need for surgery   VITAL SIGNS: Temp:  [98.1 F (36.7 C)-99.9 F (37.7 C)] 98.1 F (36.7 C) (03/27 0943) Pulse Rate:  [84-96] 91 (03/27 0547) Resp:  [18-20] 18 (03/27 0547) BP: (107-122)/(59-73) 122/73 mmHg (03/27 0547) SpO2:  [90 %-97 %] 97 % (03/27 0943)  PHYSICAL EXAMINATION: General:  65 year old aam. No acute distress. Well nourished.  Neuro:  Awake, alert, no focal def  HEENT:  jvd wnl Cardiovascular:  Rrr, no MRG Lungs:  Decreased on right, yellow cloudy drainage from CT.  Abdomen:  Soft, no tenderness. + bowel sound  Musculoskeletal:  Equal st and bulk  Skin:  Warm and dry    Recent Labs Lab 12/22/15 0350 12/23/15 0352 12/24/15 0353  NA 139 138 142  K 4.0 3.5 3.6  CL 104 107 107  CO2 BUN CREATININE 1.64* 1.31* 1.32*  GLUCOSE 119* 126* 147*    Recent Labs Lab 12/23/15 0352 12/24/15 0353 12/26/15 0359  HGB 11.3* 11.4* 11.5*  HCT 33.2* 33.1* 33.5*  WBC 22.0* 17.8* 13.9*  PLT 262 280 321     Dg Chest 1 View  12/25/2015  CLINICAL DATA:  RIGHT chest tube placement. EXAM: CHEST 1 VIEW COMPARISON:  CT 12/25/2015.  Also CT 12/23/2015. FINDINGS: Pigtail catheter has been placed within a large RIGHT chest empyema. No pneumothorax. The collection appears decreased in comparison with prior imaging studies. IMPRESSION: Satisfactory post chest tube placement. Electronically Signed   By: Elsie Stain M.D.   On: 12/25/2015 14:06   Ct Image Guided Fluid Drain By Catheter  12/25/2015  INDICATION: Admitted with pneumonia, now with complex partially loculated right-sided pleural effusion. Please perform CT-guided percutaneous drainage catheter placement. EXAM: CT IMAGE GUIDED FLUID DRAIN BY CATHETER COMPARISON:  Chest CT - 12/23/2015 MEDICATIONS: The patient is currently admitted to the hospital and receiving intravenous antibiotics. The antibiotics were administered within an appropriate time frame prior to the initiation of the procedure. ANESTHESIA/SEDATION: Moderate (conscious) sedation was employed during this procedure. A total of Versed 1.5 mg and Fentanyl 75 mcg was administered intravenously. Moderate Sedation Time: 19 minutes. The patient's level of consciousness and vital signs were monitored continuously by radiology nursing throughout the procedure under my direct supervision. CONTRAST:  None COMPLICATIONS: None immediate. PROCEDURE: Informed written consent was obtained from the patient after a discussion of the risks, benefits and alternatives to treatment. The patient was placed supine, slightly LPO on the CT gantry and a pre procedural CT was performed re-demonstrating the known moderate to large size partially loculated right-sided  pleural effusion. The procedure was planned. A timeout was performed prior to the initiation of the procedure. The skin overlying the lateral aspect of the right chest wall was prepped and draped in the usual sterile fashion. The overlying soft tissues were  anesthetized with 1% lidocaine with epinephrine. Appropriate trajectory was planned with the use of a 22 gauge spinal needle. An 18 gauge trocar needle was advanced into the abscess/fluid collection and a short Amplatz super stiff wire was coiled within the collection. Appropriate positioning was confirmed with a limited CT scan. The tract was serially dilated allowing placement of a 12 [place] all-purpose drainage catheter. Appropriate positioning was confirmed with a limited postprocedural CT scan. Following percutaneous drainage catheter placement, approximately 220 cc of serous, non foul smelling fluid was aspirated. The tube was connected to a pleural vac device and sutured in place. A dressing was placed. The patient tolerated the procedure well without immediate post procedural complication. IMPRESSION: Successful CT guided placement of a 312 French all purpose drain catheter into the right pleural space with aspiration of approximately 220 cc of serous, non foul smelling mL of pleural fluid. Samples were sent to the laboratory as requested by the ordering clinical team. Electronically Signed   By: Simonne ComeJohn  Watts M.D.   On: 12/25/2015 11:40  PCXR: marked right sided opacification. Remarkably larger than admit occupying about 2/3 right chest.  Bedside US: mod/lg effusion. Seems loculated under right scapula.  Pleural fluid 3/26: wbc. No orgs>>> ASSESSMENT / PLAN: CAP(NOS) Complicated right pleural effusion w/ significant right sided atelectasis/lung collapse He is s/p CT guided drainage. He still has significant loculation. Think that he needs VATS. He is not able to give informed consent in my opinion. I spoke to his brother [name] who is typically responsible for his decisions who would be the person to provide consent   Plan Nebs, IS, flutter and mucinex  Cont chest tube drainage I have called thoracic surgery.  Cont current abx; Low threshold change ceftriaxone to zosyn and addition vanc if spike  or declines   Simonne MartinetPeter E Babcock ACNP-BC [facility] Pager # [phone] OR # [phone] if no answer   Attending Note:  I have examined patient, reviewed labs, studies and notes. I have discussed the case with Kreg ShropshireP Babcock, and I agree with the data and plans as amended above. Pt with some clinical improvement after small-bore CT placement, but still with complicated R pleural space, loculations. Will need to consider VATS, as would suspect significant restriction to result if untreated. Will discuss w [facility].   Levy Pupaobert Shaheen Star, MD, PhD 12/26/2015, 12:13 PM [facility] [phone] or if no answer [phone]

## 2015-12-27 LAB — COMPREHENSIVE METABOLIC PANEL
ALBUMIN: 2 g/dL — AB (ref 3.5–5.0)
ALK PHOS: 115 U/L (ref 38–126)
ALT: 23 U/L (ref 17–63)
ANION GAP: 8 (ref 5–15)
AST: 20 U/L (ref 15–41)
BILIRUBIN TOTAL: 0.4 mg/dL (ref 0.3–1.2)
BUN: 13 mg/dL (ref 6–20)
CALCIUM: 8.7 mg/dL — AB (ref 8.9–10.3)
CO2: 28 mmol/L (ref 22–32)
Chloride: 106 mmol/L (ref 101–111)
Creatinine, Ser: 1.12 mg/dL (ref 0.61–1.24)
GFR calc Af Amer: >60 mL/min (ref >60)
GLUCOSE: 131 mg/dL — AB (ref 65–99)
POTASSIUM: 4 mmol/L (ref 3.5–5.1)
Sodium: 142 mmol/L (ref 135–145)
TOTAL PROTEIN: 6.2 g/dL — AB (ref 6.5–8.1)

## 2015-12-27 LAB — CBC
HEMATOCRIT: 34.8 % — AB (ref 39.0–52.0)
HEMOGLOBIN: 11.8 g/dL — AB (ref 13.0–17.0)
MCH: 29.6 pg (ref 26.0–34.0)
MCHC: 33.9 g/dL (ref 30.0–36.0)
MCV: 87.2 fL (ref 78.0–100.0)
Platelets: 382 10*3/uL (ref 150–400)
RBC: 3.99 MIL/uL — ABNORMAL LOW (ref 4.22–5.81)
RDW: 12.7 % (ref 11.5–15.5)
WBC: 11.6 10*3/uL — ABNORMAL HIGH (ref 4.0–10.5)

## 2015-12-27 LAB — BLOOD GAS, ARTERIAL
Acid-Base Excess: 3.7 mmol/L — ABNORMAL HIGH (ref 0.0–2.0)
BICARBONATE: 27.4 meq/L — AB (ref 20.0–24.0)
DRAWN BY: 290171
FIO2: 0.21
O2 Saturation: 92.3 %
PH ART: 7.461 — AB (ref 7.350–7.450)
PO2 ART: 66.2 mmHg — AB (ref 80.0–100.0)
Patient temperature: 98.6
TCO2: 28.6 mmol/L (ref 0–100)
pCO2 arterial: 39 mmHg (ref 35.0–45.0)

## 2015-12-27 LAB — PROTIME-INR
INR: 1.14 (ref 0.00–1.49)
Prothrombin Time: 14.8 seconds (ref 11.6–15.2)

## 2015-12-27 LAB — APTT: aPTT: 31 seconds (ref 24–37)

## 2015-12-27 LAB — TYPE AND SCREEN
ABO/RH(D): O POS
ANTIBODY SCREEN: NEGATIVE

## 2015-12-27 MED ORDER — VANCOMYCIN HCL IN DEXTROSE 1-5 GM/200ML-% IV SOLN
1000.0000 mg | INTRAVENOUS | Status: AC
  Administered 2015-12-28: 1000 mg via INTRAVENOUS
  Filled 2015-12-27: qty 200

## 2015-12-27 NOTE — Progress Notes (Signed)
CSW continuing to follow.   [facility] admitted from [facility].   Per RN report, [facility].   CSW will update CSW at [facility] once pt transferred in order to continue to follow for disposition planning.  Loletta SpecterSuzanna Cherine Drumgoole, MSW, LCSW Clinical Social Work [phone]

## 2015-12-27 NOTE — Progress Notes (Signed)
Report received from Heather

## 2015-12-27 NOTE — Progress Notes (Signed)
NURSING PROGRESS NOTE  Ian CandleRaynard Carter [id number] Transfer Data: 12/27/2015 5:45 PM Attending Provider: Elease EtienneAnand D Hongalgi, MD PCP:No primary care provider on file. Code Status: full  Ian Carter is a 65 y.o. male [facility] -No acute distress noted.  -No complaints of shortness of breath.  -No complaints of chest pain.   Cardiac Monitoring: Box # 0 in place. Cardiac monitor yields:no tele.  Blood pressure 123/71, pulse 92, temperature 97.5 F (36.4 C), temperature source Oral, resp. rate 16, height 6' (1.829 m), weight 87.408 kg (192 lb 11.2 oz), SpO2 98 %.   IV Fluids:  IV in place, occlusive dsg intact without redness, IV cath forearm right, condition patent and no redness normal saline.   Allergies:  Review of patient's allergies indicates no known allergies.  Past Medical History:   has a past medical history of Schizophrenia (HCC); Anxiety; Seizures (HCC); Hypertension; and Dyslipidemia.  Past Surgical History:   has no past surgical history on file.  Social History:   reports that he has been smoking Cigars.  He does not have any smokeless tobacco history on file. He reports that he does not drink alcohol or use illicit drugs.  Skin: intact except chest tube  Patient/Family orientated to room. Information packet given to patient/family. Admission inpatient armband information verified with patient/family to include name and date of birth and placed on patient arm. Side rails up x 2, fall assessment and education completed with patient/family. Patient/family able to verbalize understanding of risk associated with falls and verbalized understanding to call for assistance before getting out of bed. Call light within reach. Patient/family able to voice and demonstrate understanding of unit orientation instructions.    Will continue to evaluate and treat per MD orders.

## 2015-12-27 NOTE — Progress Notes (Signed)
PROGRESS NOTE/transfer note    Roderic Lammert  ZOX:[id number]  DOB: 20-Sep-1951  DOA: 12/20/2015 PCP: No primary care provider on file. Outpatient Specialists:   Hospital course: 65 year old male, supposedly resident of a group home, history of schizophrenia, HTN, HLD, presented to ED with right-sided pleuritic type of chest pain, cough, some dyspnea. In the ED, febrile 102F and chest x-ray showed right-sided infiltrate with effusion. CT chest confirmed RLL PNA & moderate R sided pleural effusion. Admitted for community-acquired pneumonia and right-sided pleural effusion. Pulmonology was consulted due to worsening right-sided pleural effusion. IR was consulted and patient is S/P CT-guided right thoracentesis by IR on 3/26. As per pulmonary follow-up, patient has complicated right pleural effusion which is loculated and cannot be drained by catheter alone and hence TCTS was consulted who plan VATS procedure at [facility] 12/28/15. Patient and brother agreeable. Patient will be transferred from [facility] to [facility] on 12/27/15 pending bed availability.   Assessment & Plan:   RLL Community-acquired pneumonia with complicated pleural effusion (possibly parapneumonic) - Influenza panel PCR negative. Urine Legionella antigen: Negative. Urinary streptococcal antigen: Negative. HIV antibody: Nonreactive. - Patient completed a week's course of IV ceftriaxone and 4 days of azithromycin. Off antibiotics since 3/28. - CT chest 3/22: Right lower lobe consolidation compatible with pneumonia, superimposed moderate-sized right pleural effusion. -  Blood cultures 2: negative. Urine culture: Negative. -  Fevers have resolved but if he does spike high fevers, low threshold to start Zosyn and add vancomycin. - Pulmonology was consulted for enlarging right pleural effusion. Noncontrasted CT chest ordered which showed complicated right pleural effusion (appeared loculated by  bedside ultrasound) with significant right-sided atelectasis & consolidation.  - S/P CT-guided right thoracentesis and placement of drainage catheter by IR on 3/26, yielding 220 mL of serous fluid. Pleural fluid culture negative to date.. - As per pulmonology follow-up, despite CT-guided drainage and catheter placement, patient has significant loculation and feels that he needs VATS. TCTS input 12/26/15 appreciated: Recommend VATS to drain the effusion and decorticate the lung if needed. Discussed extensively with patient in person and with brother over phone and both are agreeable. Discussed with Dr. Dorris Fetch. Arrange for transfer to [facility] for possible surgery 3/29. Based on available data, patient is at low risk for perioperative CV events and may proceed with surgery without any further cardiac workup with usual close perioperative monitoring.  Essential hypertension - Controlled on amlodipine. HCTZ held temporarily.  Hyperlipidemia - Continue statins.  Schizophrenia - Stable without complaints. Continue home medications-haloperidol and benztropine. Patient apparently is on carbamazepine at home for unclear indications. Patient apparently follows with mental health as outpatient.   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 Stable. -  Renal ultrasound without hydronephrosis.   Leukocytosis -  Most likely secondary to pneumonia. No diarrhea reported. Improving. Need to rule out empyema.  Anemia - Stable.    DVT prophylaxis: Lovenox Code Status: Full Family Communication: Discussed with patient's brother Mr. Miloh Alcocer on 12/27/15. Updated care and answered questions.  Disposition Plan: patient was admitted to the [facility] on 12/20/15. Transfer to [facility] on 12/27/15 for possible VATS procedure by cardiothoracic surgery on 12/28/15. DC home when medically  stable.   Consultants:  Pulmonology  IR  TCTS  Procedures:  3/26: CT guided placed of a 12 Fr drainage catheter placement into the right pleural space yielding 220 cc of serous fluid.   Antimicrobials:  Azithromycin 3/21 > 3/25  Rocephin 3/21 >  3/27   Subjective: Mild chest wall soreness at site of chest tube. No dyspnea reported. Denies any other complaints. Agreeable to surgery/VATS procedure.   Objective: Filed Vitals:   12/26/15 0943 12/26/15 1441 12/26/15 2132 12/27/15 0500  BP:  119/64 121/68 104/66  Pulse:  87 86 90  Temp: 98.1 F (36.7 C) 98.8 F (37.1 C) 98.3 F (36.8 C) 98.7 F (37.1 C)  TempSrc: Oral Oral Oral Oral  Resp:  20 20 20   Height:      Weight:      SpO2: 97% 96% 94% 94%    Intake/Output Summary (Last 24 hours) at 12/27/15 1302 Last data filed at 12/27/15 0953  Gross per 24 hour  Intake    728 ml  Output    750 ml  Net    -22 ml   Filed Weights   12/20/15 2243 12/21/15 0140  Weight: 90.266 kg (199 lb) 87.408 kg (192 lb 11.2 oz)    Exam:  General exam: Pleasant middle-aged male lying comfortably propped up in bed.  Respiratory system: Improved breath sounds in right base. Rest of lung fields clear to auscultation. No pleural rub heard. No increased work of breathing. New R chest 12 French drainage catheter connected to pleur-vac arise. Draining straw-colored fluid. Cardiovascular system: S1 & S2 heard, RRR. No JVD, murmurs, gallops, clicks or pedal edema. Gastrointestinal system: Abdomen is nondistended, soft and nontender. Normal bowel sounds heard. Central nervous system: Alert and oriented. No focal neurological deficits. Extremities: Symmetric 5 x 5 power.   Data Reviewed: Basic Metabolic Panel:  Recent Labs Lab 12/20/15 2331 12/21/15 0342 12/22/15 0350 12/23/15 0352 12/24/15 0353  NA 137 138 139 138 142  K 3.8 3.8 4.0 3.5 3.6  CL 102 101 104 107 107  CO2 24 27 28 23 25   GLUCOSE 128* 112* 119* 126* 147*  BUN 20 20  20 17 16   CREATININE 1.46* 1.42* 1.64* 1.31* 1.32*  CALCIUM 8.6* 8.6* 8.4* 8.2* 8.5*   Liver Function Tests:  Recent Labs Lab 12/20/15 2331 12/21/15 0342  AST 18 17  ALT 12* 12*  ALKPHOS 102 92  BILITOT 0.9 0.7  PROT 7.1 6.7  ALBUMIN 3.2* 3.0*    Recent Labs Lab 12/20/15 2331  LIPASE 35   No results for input(s): AMMONIA in the last 168 hours. CBC:  Recent Labs Lab 12/20/15 2256 12/21/15 0342 12/22/15 0350 12/23/15 0352 12/24/15 0353 12/26/15 0359  WBC 18.9* 18.8* 27.0* 22.0* 17.8* 13.9*  NEUTROABS 15.9* 15.7*  --   --   --   --   HGB 12.7* 12.4* 12.1* 11.3* 11.4* 11.5*  HCT 38.8* 37.8* 34.8* 33.2* 33.1* 33.5*  MCV 89.2 89.4 85.5 86.7 86.2 85.2  PLT 277 264 242 262 280 321   Cardiac Enzymes: No results for input(s): CKTOTAL, CKMB, CKMBINDEX, TROPONINI in the last 168 hours. BNP (last 3 results) No results for input(s): PROBNP in the last 8760 hours. CBG: No results for input(s): GLUCAP in the last 168 hours.  Recent Results (from the past 240 hour(s))  Culture, blood (routine x 2)     Status: None   Collection Time: 12/20/15 10:55 PM  Result Value Ref Range Status   Specimen Description BLOOD RIGHT ANTECUBITAL  Final   Special Requests BOTTLES DRAWN AEROBIC AND ANAEROBIC 5 ML  Final   Culture   Final    NO GROWTH 5 DAYS [facility]  Report Status 12/26/2015 FINAL  Final  Culture, blood (routine x 2)     Status: None   Collection Time: 12/20/15 10:56 PM  Result Value Ref Range Status   Specimen Description BLOOD BLOOD LEFT FOREARM  Final   Special Requests BOTTLES DRAWN AEROBIC AND ANAEROBIC 5 ML  Final   Culture   Final    NO GROWTH 5 DAYS Performed at [facility]    Report Status 12/26/2015 FINAL  Final  Urine culture     Status: None   Collection Time: 12/21/15  2:01 AM  Result Value Ref Range Status   Specimen Description URINE, RANDOM  Final   Special Requests NONE  Final   Culture   Final    NO GROWTH 1  DAY Performed at [facility]    Report Status 12/22/2015 FINAL  Final  Culture, sputum-assessment     Status: None   Collection Time: 12/23/15  6:37 AM  Result Value Ref Range Status   Specimen Description SPUTUM  Final   Special Requests NONE  Final   Sputum evaluation   Final    MICROSCOPIC FINDINGS SUGGEST THAT THIS SPECIMEN IS NOT REPRESENTATIVE OF LOWER RESPIRATORY SECRETIONS. PLEASE RECOLLECT. NOTIFIED RN AT [phone] ON 03.24.17 BY SHUEA    Report Status 12/23/2015 FINAL  Final  Body fluid culture     Status: None (Preliminary result)   Collection Time: 12/25/15 10:14 AM  Result Value Ref Range Status   Specimen Description PLEURAL DRAIN PLACEMENT  Final   Special Requests NONE  Final   Gram Stain   Final    CYTOSPIN SLIDE WBC PRESENT, PREDOMINANTLY PMN NO ORGANISMS SEEN    Culture   Final    NO GROWTH 2 DAYS Performed at [facility]    Report Status PENDING  Incomplete         Studies: No results found.      Scheduled Meds: . amLODipine  5 mg Oral Daily  . aspirin EC  81 mg Oral Daily  . benztropine  0.5 mg Oral BID  . carbamazepine  400 mg Oral QHS  . docusate sodium  100 mg Oral BID  . enoxaparin (LOVENOX) injection  40 mg Subcutaneous Q24H  . haloperidol  20 mg Oral QHS  . loratadine  10 mg Oral Daily  . simvastatin  10 mg Oral QHS   Continuous Infusions:    Principal Problem:   CAP (community acquired pneumonia) Active Problems:   HTN (hypertension)   HLD (hyperlipidemia)   Pleural effusion   AKI (acute kidney injury) (HCC)   Loculated pleural effusion    Time spent: 25 minutes.    Marcellus Scott, MD, FACP, FHM. [facility] Pager [phone]  If 7PM-7AM, please contact night-coverage [email or web address] Password [email or web address] 12/27/2015, 1:02 PM    LOS: 6 days

## 2015-12-27 NOTE — Progress Notes (Signed)
[address]       [place] [address]             [phone]      I met with Mr. Ching today and also spoke with his brother Iona Beard by telephone.  I recommended we do a right VATS, drainage of pleural effusion and possible decortication. They understand the purpose is to remove the fluid and reexpand the lung. I reviewed the indications, risks, benefits and alternatives. They understand the risks include but are not limited to death, MI, DVT, PE, bleeding, possible need for transfusion, infection, prolonged air leak, as well as other unforeseeable complications. Mr. Lamantia understands that there are some risks involved.  He is being transferred to [facility] this evening. Plan surgery tomorrow.  Revonda Standard Roxan Hockey, MD [facility] [phone]

## 2015-12-28 ENCOUNTER — Encounter (HOSPITAL_COMMUNITY): Payer: Self-pay | Admitting: Certified Registered Nurse Anesthetist

## 2015-12-28 ENCOUNTER — Inpatient Hospital Stay (HOSPITAL_COMMUNITY): Payer: Medicare Other

## 2015-12-28 ENCOUNTER — Encounter (HOSPITAL_COMMUNITY)
Admission: EM | Disposition: A | Discharge status: Home / Self Care | Payer: Self-pay | Source: Home / Self Care | Attending: Internal Medicine

## 2015-12-28 ENCOUNTER — Inpatient Hospital Stay (HOSPITAL_COMMUNITY): Payer: Medicare Other | Admitting: Certified Registered Nurse Anesthetist

## 2015-12-28 DIAGNOSIS — J869 Pyothorax without fistula: Secondary | ICD-10-CM | POA: Diagnosis present

## 2015-12-28 HISTORY — PX: VIDEO ASSISTED THORACOSCOPY (VATS)/DECORTICATION: SHX6171

## 2015-12-28 HISTORY — PX: PLEURAL EFFUSION DRAINAGE: SHX5099

## 2015-12-28 LAB — URINE MICROSCOPIC-ADD ON

## 2015-12-28 LAB — URINALYSIS, ROUTINE W REFLEX MICROSCOPIC
Bilirubin Urine: NEGATIVE
Glucose, UA: NEGATIVE mg/dL
Ketones, ur: NEGATIVE mg/dL
Leukocytes, UA: NEGATIVE
Nitrite: NEGATIVE
Protein, ur: 30 mg/dL — AB
Specific Gravity, Urine: 1.022 (ref 1.005–1.030)
pH: 6 (ref 5.0–8.0)

## 2015-12-28 LAB — GRAM STAIN

## 2015-12-28 LAB — ABO/RH: ABO/RH(D): O POS

## 2015-12-28 SURGERY — VIDEO ASSISTED THORACOSCOPY (VATS)/DECORTICATION
Anesthesia: General | Site: Chest | Laterality: Right

## 2015-12-28 MED ORDER — ONDANSETRON HCL 4 MG/2ML IJ SOLN: 4.0000 mg | Freq: Four times a day (QID) | INTRAMUSCULAR | Status: DC | PRN

## 2015-12-28 MED ORDER — PROPOFOL 10 MG/ML IV BOLUS
INTRAVENOUS | Status: DC | PRN
  Administered 2015-12-28 (×2): 10 mg via INTRAVENOUS
  Administered 2015-12-28: 180 mg via INTRAVENOUS

## 2015-12-28 MED ORDER — VECURONIUM BROMIDE 10 MG IV SOLR
INTRAVENOUS | Status: AC
  Filled 2015-12-28: qty 10

## 2015-12-28 MED ORDER — ACETAMINOPHEN 500 MG PO TABS
1000.0000 mg | ORAL_TABLET | Freq: Four times a day (QID) | ORAL | Status: DC
  Administered 2015-12-29 – 2016-01-02 (×15): 1000 mg via ORAL
  Filled 2015-12-28 (×16): qty 2

## 2015-12-28 MED ORDER — LIDOCAINE HCL (CARDIAC) 20 MG/ML IV SOLN
INTRAVENOUS | Status: DC | PRN
  Administered 2015-12-28: 50 mg via INTRAVENOUS

## 2015-12-28 MED ORDER — SODIUM CHLORIDE 0.9% FLUSH: 9.0000 mL | INTRAVENOUS | Status: DC | PRN

## 2015-12-28 MED ORDER — ACETAMINOPHEN 160 MG/5ML PO SOLN: 1000.0000 mg | Freq: Four times a day (QID) | ORAL | Status: DC

## 2015-12-28 MED ORDER — SUCCINYLCHOLINE CHLORIDE 20 MG/ML IJ SOLN
INTRAMUSCULAR | Status: AC
  Filled 2015-12-28: qty 1

## 2015-12-28 MED ORDER — DEXTROSE-NACL 5-0.9 % IV SOLN
INTRAVENOUS | Status: DC
  Administered 2015-12-28: 19:00:00 via INTRAVENOUS

## 2015-12-28 MED ORDER — FENTANYL CITRATE (PF) 100 MCG/2ML IJ SOLN
INTRAMUSCULAR | Status: AC
  Filled 2015-12-28: qty 2

## 2015-12-28 MED ORDER — STERILE WATER FOR INJECTION IJ SOLN
INTRAMUSCULAR | Status: AC
  Filled 2015-12-28: qty 10

## 2015-12-28 MED ORDER — HYDROMORPHONE HCL 1 MG/ML IJ SOLN
INTRAMUSCULAR | Status: AC
  Filled 2015-12-28: qty 1

## 2015-12-28 MED ORDER — VANCOMYCIN HCL IN DEXTROSE 1-5 GM/200ML-% IV SOLN
1000.0000 mg | Freq: Two times a day (BID) | INTRAVENOUS | Status: DC
  Administered 2015-12-29 (×2): 1000 mg via INTRAVENOUS
  Filled 2015-12-28 (×3): qty 200

## 2015-12-28 MED ORDER — LACTATED RINGERS IV SOLN
INTRAVENOUS | Status: DC
  Administered 2015-12-28 (×2): via INTRAVENOUS

## 2015-12-28 MED ORDER — PHENYLEPHRINE 40 MCG/ML (10ML) SYRINGE FOR IV PUSH (FOR BLOOD PRESSURE SUPPORT)
PREFILLED_SYRINGE | INTRAVENOUS | Status: AC
  Filled 2015-12-28: qty 10

## 2015-12-28 MED ORDER — ONDANSETRON HCL 4 MG/2ML IJ SOLN
INTRAMUSCULAR | Status: DC | PRN
  Administered 2015-12-28: 4 mg via INTRAVENOUS

## 2015-12-28 MED ORDER — MIDAZOLAM HCL 2 MG/2ML IJ SOLN
INTRAMUSCULAR | Status: AC
  Filled 2015-12-28: qty 2

## 2015-12-28 MED ORDER — FENTANYL CITRATE (PF) 100 MCG/2ML IJ SOLN
INTRAMUSCULAR | Status: DC | PRN
  Administered 2015-12-28: 100 ug via INTRAVENOUS
  Administered 2015-12-28 (×4): 50 ug via INTRAVENOUS

## 2015-12-28 MED ORDER — PROPOFOL 10 MG/ML IV BOLUS
INTRAVENOUS | Status: AC
  Filled 2015-12-28: qty 20

## 2015-12-28 MED ORDER — SUGAMMADEX SODIUM 200 MG/2ML IV SOLN
INTRAVENOUS | Status: AC
  Filled 2015-12-28: qty 2

## 2015-12-28 MED ORDER — VANCOMYCIN HCL 10 G IV SOLR
2000.0000 mg | Freq: Once | INTRAVENOUS | Status: AC
  Administered 2015-12-28: 2000 mg via INTRAVENOUS
  Filled 2015-12-28: qty 2000

## 2015-12-28 MED ORDER — VECURONIUM BROMIDE 10 MG IV SOLR
INTRAVENOUS | Status: DC | PRN
  Administered 2015-12-28: 2 mg via INTRAVENOUS
  Administered 2015-12-28: 4 mg via INTRAVENOUS
  Administered 2015-12-28: 1 mg via INTRAVENOUS
  Administered 2015-12-28 (×2): 2 mg via INTRAVENOUS

## 2015-12-28 MED ORDER — LORAZEPAM 2 MG/ML IJ SOLN
INTRAMUSCULAR | Status: AC
Start: 2015-12-28 — End: 2015-12-28
  Filled 2015-12-28: qty 1

## 2015-12-28 MED ORDER — DEXAMETHASONE SODIUM PHOSPHATE 10 MG/ML IJ SOLN
INTRAMUSCULAR | Status: DC | PRN
  Administered 2015-12-28: 10 mg via INTRAVENOUS

## 2015-12-28 MED ORDER — PHENYLEPHRINE HCL 10 MG/ML IJ SOLN
10.0000 mg | INTRAMUSCULAR | Status: DC | PRN
  Administered 2015-12-28: 25 ug/min via INTRAVENOUS

## 2015-12-28 MED ORDER — PIPERACILLIN-TAZOBACTAM 3.375 G IVPB
3.3750 g | Freq: Three times a day (TID) | INTRAVENOUS | Status: DC
  Administered 2015-12-28 – 2015-12-30 (×5): 3.375 g via INTRAVENOUS
  Filled 2015-12-28 (×6): qty 50

## 2015-12-28 MED ORDER — POTASSIUM CHLORIDE 10 MEQ/50ML IV SOLN: 10.0000 meq | Freq: Every day | INTRAVENOUS | Status: DC | PRN

## 2015-12-28 MED ORDER — ROCURONIUM BROMIDE 100 MG/10ML IV SOLN
INTRAVENOUS | Status: DC | PRN
  Administered 2015-12-28: 50 mg via INTRAVENOUS

## 2015-12-28 MED ORDER — SUGAMMADEX SODIUM 200 MG/2ML IV SOLN
INTRAVENOUS | Status: DC | PRN
  Administered 2015-12-28: 200 mg via INTRAVENOUS

## 2015-12-28 MED ORDER — LIDOCAINE HCL (CARDIAC) 20 MG/ML IV SOLN
INTRAVENOUS | Status: AC
  Filled 2015-12-28: qty 10

## 2015-12-28 MED ORDER — PANTOPRAZOLE SODIUM 40 MG PO TBEC
40.0000 mg | DELAYED_RELEASE_TABLET | Freq: Every day | ORAL | Status: DC
  Administered 2015-12-28 – 2016-01-03 (×7): 40 mg via ORAL
  Filled 2015-12-28 (×7): qty 1

## 2015-12-28 MED ORDER — 0.9 % SODIUM CHLORIDE (POUR BTL) OPTIME
TOPICAL | Status: DC | PRN
Start: 2015-12-28 — End: 2015-12-28
  Administered 2015-12-28: 2000 mL

## 2015-12-28 MED ORDER — FENTANYL 40 MCG/ML IV SOLN
INTRAVENOUS | Status: AC
  Filled 2015-12-28: qty 25

## 2015-12-28 MED ORDER — VANCOMYCIN HCL 10 G IV SOLR
2000.0000 mg | Freq: Once | INTRAVENOUS | Status: DC
  Filled 2015-12-28: qty 2000

## 2015-12-28 MED ORDER — DEXAMETHASONE SODIUM PHOSPHATE 10 MG/ML IJ SOLN
INTRAMUSCULAR | Status: AC
  Filled 2015-12-28: qty 1

## 2015-12-28 MED ORDER — DIPHENHYDRAMINE HCL 50 MG/ML IJ SOLN: 12.5000 mg | Freq: Four times a day (QID) | INTRAMUSCULAR | Status: DC | PRN

## 2015-12-28 MED ORDER — FENTANYL CITRATE (PF) 250 MCG/5ML IJ SOLN
INTRAMUSCULAR | Status: AC
  Filled 2015-12-28: qty 5

## 2015-12-28 MED ORDER — LACTATED RINGERS IV SOLN
INTRAVENOUS | Status: DC | PRN
  Administered 2015-12-28 (×2): via INTRAVENOUS

## 2015-12-28 MED ORDER — MIDAZOLAM HCL 5 MG/5ML IJ SOLN
INTRAMUSCULAR | Status: DC | PRN
  Administered 2015-12-28: 1 mg via INTRAVENOUS

## 2015-12-28 MED ORDER — DIPHENHYDRAMINE HCL 12.5 MG/5ML PO ELIX: 12.5000 mg | ORAL_SOLUTION | Freq: Four times a day (QID) | ORAL | Status: DC | PRN

## 2015-12-28 MED ORDER — ROCURONIUM BROMIDE 50 MG/5ML IV SOLN
INTRAVENOUS | Status: AC
  Filled 2015-12-28: qty 5

## 2015-12-28 MED ORDER — FENTANYL 40 MCG/ML IV SOLN
INTRAVENOUS | Status: DC
  Administered 2015-12-28: 18:00:00 via INTRAVENOUS
  Administered 2015-12-28: 20 ug via INTRAVENOUS
  Administered 2015-12-29: 110 ug via INTRAVENOUS
  Administered 2015-12-29: 130 ug via INTRAVENOUS
  Administered 2015-12-29: 100 ug via INTRAVENOUS
  Administered 2015-12-29: 80 ug via INTRAVENOUS
  Administered 2015-12-30: 10 ug via INTRAVENOUS
  Administered 2015-12-30: 110 ug via INTRAVENOUS
  Administered 2015-12-30: 1 ug via INTRAVENOUS
  Administered 2015-12-30: 25 mL via INTRAVENOUS
  Filled 2015-12-28: qty 25

## 2015-12-28 MED ORDER — HYDROMORPHONE HCL 1 MG/ML IJ SOLN
0.2500 mg | INTRAMUSCULAR | Status: DC | PRN
  Administered 2015-12-28 (×2): 0.5 mg via INTRAVENOUS

## 2015-12-28 MED ORDER — BISACODYL 5 MG PO TBEC
10.0000 mg | DELAYED_RELEASE_TABLET | Freq: Every day | ORAL | Status: DC
  Administered 2015-12-28 – 2016-01-03 (×4): 10 mg via ORAL
  Filled 2015-12-28 (×5): qty 2

## 2015-12-28 MED ORDER — ALBUTEROL SULFATE (2.5 MG/3ML) 0.083% IN NEBU
2.5000 mg | INHALATION_SOLUTION | RESPIRATORY_TRACT | Status: DC
  Administered 2015-12-28 – 2015-12-29 (×5): 2.5 mg via RESPIRATORY_TRACT
  Filled 2015-12-28 (×5): qty 3

## 2015-12-28 MED ORDER — PHENYLEPHRINE HCL 10 MG/ML IJ SOLN
INTRAMUSCULAR | Status: DC | PRN
  Administered 2015-12-28: 160 ug via INTRAVENOUS

## 2015-12-28 MED ORDER — SENNOSIDES-DOCUSATE SODIUM 8.6-50 MG PO TABS
1.0000 | ORAL_TABLET | Freq: Every day | ORAL | Status: DC
  Administered 2015-12-28 – 2015-12-30 (×3): 1 via ORAL
  Filled 2015-12-28 (×4): qty 1

## 2015-12-28 MED ORDER — NALOXONE HCL 0.4 MG/ML IJ SOLN: 0.4000 mg | INTRAMUSCULAR | Status: DC | PRN

## 2015-12-28 MED ORDER — LORAZEPAM 2 MG/ML IJ SOLN
0.5000 mg | Freq: Once | INTRAMUSCULAR | Status: AC
Start: 2015-12-28 — End: 2015-12-28
  Administered 2015-12-28: 0.5 mg via INTRAVENOUS

## 2015-12-28 MED ORDER — HALOPERIDOL 5 MG PO TABS
20.0000 mg | ORAL_TABLET | Freq: Every day | ORAL | Status: DC
  Administered 2015-12-29 – 2016-01-02 (×5): 20 mg via ORAL
  Filled 2015-12-28: qty 10
  Filled 2015-12-28 (×5): qty 4

## 2015-12-28 SURGICAL SUPPLY — 80 items
ADH SKN CLS APL DERMABOND .7 (GAUZE/BANDAGES/DRESSINGS) ×1
APL SRG 22X2 LUM MLBL SLNT (VASCULAR PRODUCTS)
APPLICATOR TIP EXT COSEAL (VASCULAR PRODUCTS) IMPLANT
BAG SPEC RTRVL LRG 6X4 10 (ENDOMECHANICALS)
CANISTER SUCTION 2500CC (MISCELLANEOUS) ×3 IMPLANT
CATH KIT ON Q 5IN SLV (PAIN MANAGEMENT) IMPLANT
CATH THORACIC 28FR (CATHETERS) ×2 IMPLANT
CATH THORACIC 28FR RT ANG (CATHETERS) IMPLANT
CATH THORACIC 36FR (CATHETERS) IMPLANT
CATH THORACIC 36FR RT ANG (CATHETERS) IMPLANT
CLIP TI MEDIUM 6 (CLIP) ×3 IMPLANT
CONN ST 1/4X3/8  BEN (MISCELLANEOUS)
CONN ST 1/4X3/8 BEN (MISCELLANEOUS) IMPLANT
CONN Y 3/8X3/8X3/8  BEN (MISCELLANEOUS)
CONN Y 3/8X3/8X3/8 BEN (MISCELLANEOUS) IMPLANT
CONT SPEC 4OZ CLIKSEAL STRL BL (MISCELLANEOUS) ×10 IMPLANT
COUNTER NEEDLE 20 DBL MAG RED (NEEDLE) ×2 IMPLANT
COVER SURGICAL LIGHT HANDLE (MISCELLANEOUS) ×3 IMPLANT
DERMABOND ADVANCED (GAUZE/BANDAGES/DRESSINGS) ×2
DERMABOND ADVANCED .7 DNX12 (GAUZE/BANDAGES/DRESSINGS) IMPLANT
DRAIN CHANNEL 32F RND 10.7 FF (WOUND CARE) ×4 IMPLANT
DRAPE LAPAROSCOPIC ABDOMINAL (DRAPES) ×3 IMPLANT
DRAPE WARM FLUID 44X44 (DRAPE) ×3 IMPLANT
ELECT BLADE 6.5 EXT (BLADE) ×3 IMPLANT
ELECT REM PT RETURN 9FT ADLT (ELECTROSURGICAL) ×3
ELECTRODE REM PT RTRN 9FT ADLT (ELECTROSURGICAL) ×1 IMPLANT
GAUZE SPONGE 4X4 12PLY STRL (GAUZE/BANDAGES/DRESSINGS) ×3 IMPLANT
GLOVE BIOGEL M STRL SZ7.5 (GLOVE) ×2 IMPLANT
GLOVE BIOGEL PI IND STRL 6.5 (GLOVE) IMPLANT
GLOVE BIOGEL PI INDICATOR 6.5 (GLOVE) ×8
GLOVE SS BIOGEL STRL SZ 6.5 (GLOVE) IMPLANT
GLOVE SUPERSENSE BIOGEL SZ 6.5 (GLOVE) ×2
GLOVE SURG SIGNA 7.5 PF LTX (GLOVE) ×6 IMPLANT
GOWN STRL REUS W/ TWL LRG LVL3 (GOWN DISPOSABLE) ×2 IMPLANT
GOWN STRL REUS W/ TWL XL LVL3 (GOWN DISPOSABLE) ×1 IMPLANT
GOWN STRL REUS W/TWL LRG LVL3 (GOWN DISPOSABLE) ×6
GOWN STRL REUS W/TWL XL LVL3 (GOWN DISPOSABLE) ×3
HANDLE STAPLE ENDO GIA SHORT (STAPLE)
HEMOSTAT SURGICEL 2X14 (HEMOSTASIS) IMPLANT
KIT BASIN OR (CUSTOM PROCEDURE TRAY) ×3 IMPLANT
KIT ROOM TURNOVER OR (KITS) ×3 IMPLANT
NS IRRIG 1000ML POUR BTL (IV SOLUTION) ×9 IMPLANT
PACK CHEST (CUSTOM PROCEDURE TRAY) ×3 IMPLANT
PAD ARMBOARD 7.5X6 YLW CONV (MISCELLANEOUS) ×6 IMPLANT
POUCH ENDO CATCH II 15MM (MISCELLANEOUS) IMPLANT
POUCH SPECIMEN RETRIEVAL 10MM (ENDOMECHANICALS) IMPLANT
SEALANT PROGEL (MISCELLANEOUS) IMPLANT
SEALANT SURG COSEAL 4ML (VASCULAR PRODUCTS) IMPLANT
SEALANT SURG COSEAL 8ML (VASCULAR PRODUCTS) IMPLANT
SOLUTION ANTI FOG 6CC (MISCELLANEOUS) ×3 IMPLANT
SPONGE GAUZE 4X4 12PLY STER LF (GAUZE/BANDAGES/DRESSINGS) ×2 IMPLANT
SPONGE INTESTINAL PEANUT (DISPOSABLE) ×14 IMPLANT
SPONGE LAP 18X18 X RAY DECT (DISPOSABLE) ×2 IMPLANT
SPONGE TONSIL 1 RF SGL (DISPOSABLE) ×3 IMPLANT
STAPLER ENDO GIA 12 SHRT THIN (STAPLE) IMPLANT
STAPLER ENDO GIA 12MM SHORT (STAPLE) IMPLANT
SUT PROLENE 4 0 RB 1 (SUTURE) ×3
SUT PROLENE 4-0 RB1 .5 CRCL 36 (SUTURE) IMPLANT
SUT SILK  1 MH (SUTURE) ×10
SUT SILK 1 MH (SUTURE) ×2 IMPLANT
SUT SILK 1 TIES 10X30 (SUTURE) ×3 IMPLANT
SUT SILK 2 0SH CR/8 30 (SUTURE) IMPLANT
SUT SILK 3 0SH CR/8 30 (SUTURE) IMPLANT
SUT VIC AB 1 CTX 36 (SUTURE) ×3
SUT VIC AB 1 CTX36XBRD ANBCTR (SUTURE) IMPLANT
SUT VIC AB 2-0 CTX 36 (SUTURE) ×2 IMPLANT
SUT VIC AB 3-0 MH 27 (SUTURE) IMPLANT
SUT VIC AB 3-0 X1 27 (SUTURE) ×2 IMPLANT
SUT VICRYL 2 TP 1 (SUTURE) IMPLANT
SYSTEM SAHARA CHEST DRAIN RE-I (WOUND CARE) ×3 IMPLANT
TAPE CLOTH 4X10 WHT NS (GAUZE/BANDAGES/DRESSINGS) ×3 IMPLANT
TAPE CLOTH SURG 4X10 WHT LF (GAUZE/BANDAGES/DRESSINGS) ×2 IMPLANT
TIP APPLICATOR SPRAY EXTEND 16 (VASCULAR PRODUCTS) IMPLANT
TOWEL OR 17X24 6PK STRL BLUE (TOWEL DISPOSABLE) ×6 IMPLANT
TOWEL OR 17X26 10 PK STRL BLUE (TOWEL DISPOSABLE) ×6 IMPLANT
TRAP SPECIMEN MUCOUS 40CC (MISCELLANEOUS) ×4 IMPLANT
TRAY FOLEY CATH 16FRSI W/METER (SET/KITS/TRAYS/PACK) ×3 IMPLANT
TROCAR XCEL BLADELESS 5X75MML (TROCAR) ×3 IMPLANT
TUNNELER SHEATH ON-Q 11GX8 DSP (PAIN MANAGEMENT) IMPLANT
WATER STERILE IRR 1000ML POUR (IV SOLUTION) ×3 IMPLANT

## 2015-12-28 NOTE — Anesthesia Preprocedure Evaluation (Addendum)
Anesthesia Evaluation  Patient identified by MRN, date of birth, ID band Patient awake    Reviewed: Allergy & Precautions, NPO status , Patient's Chart, lab work & pertinent test results  Airway Mallampati: II  TM Distance: >3 FB Neck ROM: Full    Dental  (+) Dental Advisory Given, Loose, Chipped, Poor Dentition   Pulmonary pneumonia, Current Smoker,    breath sounds clear to auscultation       Cardiovascular hypertension,  Rhythm:Regular Rate:Normal     Neuro/Psych Schizophrenia    GI/Hepatic negative GI ROS, Neg liver ROS,   Endo/Other    Renal/GU Renal disease     Musculoskeletal   Abdominal   Peds  Hematology   Anesthesia Other Findings   Reproductive/Obstetrics                            Anesthesia Physical Anesthesia Plan  ASA: III  Anesthesia Plan: General   Post-op Pain Management:    Induction: Intravenous  Airway Management Planned: Double Lumen EBT  Additional Equipment:   Intra-op Plan:   Post-operative Plan: Possible Post-op intubation/ventilation  Informed Consent: I have reviewed the patients History and Physical, chart, labs and discussed the procedure including the risks, benefits and alternatives for the proposed anesthesia with the patient or authorized representative who has indicated his/her understanding and acceptance.   Dental advisory given  Plan Discussed with: CRNA and Anesthesiologist  Anesthesia Plan Comments:         Anesthesia Quick Evaluation

## 2015-12-28 NOTE — Interval H&P Note (Signed)
History and Physical Interval Note:  12/28/2015 1:09 PM  Ian Carter  has presented today for surgery, with the diagnosis of RIGHT PLEURAL EFFUSION  The various methods of treatment have been discussed with the patient and family. After consideration of risks, benefits and other options for treatment, the patient has consented to  Procedure(s): VIDEO ASSISTED THORACOSCOPY (VATS)/DECORTICATION (Right) DRAINAGE OF PLEURAL EFFUSION (Right) as a surgical intervention .  The patient's history has been reviewed, patient examined, no change in status, stable for surgery.  I have reviewed the patient's chart and labs.  Questions were answered to the patient's satisfaction.     Loreli SlotSteven C Kenitra Leventhal

## 2015-12-28 NOTE — Progress Notes (Signed)
Pharmacy Antibiotic Note  Ian Carter is a 65 y.o. male admitted on 12/20/2015 with pneumonia and empyema.  Pharmacy has been consulted for vancomycin/Zosyn dosing.  - Patient completed a week's course of IV ceftriaxone and 4 days of azithromycin. Last dose abx 3/27  Plan: Give vancomycin 2000 mg IV x 1, followed by vancomycin 1000 mg IV every 12 hours.  Goal trough 15-20 mcg/mL. Zosyn 3.375g IV q8h (4 hour infusion) Monitor clinical progress, c/s, renal function, abx plan/LOT VT@SS  as indicated   Height: 6' (182.9 cm) Weight: 192 lb 11.2 oz (87.408 kg) IBW/kg (Calculated) : 77.6  Temp (24hrs), Avg:98 F (36.7 C), Min:96 F (35.6 C), Max:100 F (37.8 C)   Recent Labs Lab 12/22/15 0350 12/23/15 0352 12/24/15 0353 12/26/15 0359 12/27/15 2140  WBC 27.0* 22.0* 17.8* 13.9* 11.6*  CREATININE 1.64* 1.31* 1.32*  --  1.12    Estimated Creatinine Clearance: 73.1 mL/min (by C-G formula based on Cr of 1.12).    No Known Allergies  Antimicrobials this admission: 3/29 vancomycin >>  3/29 Zosyn >>   Dose adjustments this admission: n/a  Microbiology results: 3/21 BCx: ngf  3/26 body fluid cx>> ng x 3 days 3/29 MRSA PCR: sent 3/29 AFB>>ip 3/29 Tissue cx>>ip   Thank you for allowing us to participate in this patients care. Signe Coltonya C Roe Koffman, PharmD Pager: [phone] 12/28/2015 6:56 PM

## 2015-12-28 NOTE — Anesthesia Postprocedure Evaluation (Signed)
Anesthesia Post Note  Patient: Ian Carter  Procedure(s) Performed: Procedure(s) (LRB): RIGHT VIDEO ASSISTED THORACOSCOPY (VATS)/DECORTICATION (Right) DRAINAGE OF RIGHT  PLEURAL EFFUSION (Right)  Patient location during evaluation: PACU Anesthesia Type: General Level of consciousness: awake Pain management: pain level controlled Vital Signs Assessment: post-procedure vital signs reviewed and stable Respiratory status: spontaneous breathing Cardiovascular status: stable Anesthetic complications: no    Last Vitals:  Filed Vitals:   12/28/15 1715 12/28/15 1730  BP: 116/76 123/108  Pulse: 91 92  Temp:    Resp: 26 25    Last Pain:  Filed Vitals:   12/28/15 1736  PainSc: 0-No pain                 EDWARDS,Newell Wafer

## 2015-12-28 NOTE — Progress Notes (Signed)
CT surgery p.m. Rounds  Status post right VATS for drainage of loculated                effusion-empyema Hemodynamics stable Oxygen saturation 93% on nasal cannula Pain adequately controlled

## 2015-12-28 NOTE — H&P (View-Only) (Signed)
[address]       [place] [address]             [phone]      I met with Ian Carter today and also spoke with his brother Ian Carter by telephone.  I recommended we do a right VATS, drainage of pleural effusion and possible decortication. They understand the purpose is to remove the fluid and reexpand the lung. I reviewed the indications, risks, benefits and alternatives. They understand the risks include but are not limited to death, MI, DVT, PE, bleeding, possible need for transfusion, infection, prolonged air leak, as well as other unforeseeable complications. Ian Carter understands that there are some risks involved.  He is being transferred to [facility] this evening. Plan surgery tomorrow.  Natlie Asfour C. Parnika Tweten, MD [facility] [phone]  

## 2015-12-28 NOTE — Transfer of Care (Signed)
Immediate Anesthesia Transfer of Care Note  Patient: Ian Carter  Procedure(s) Performed: Procedure(s): RIGHT VIDEO ASSISTED THORACOSCOPY (VATS)/DECORTICATION (Right) DRAINAGE OF RIGHT  PLEURAL EFFUSION (Right)  Patient Location: PACU  Anesthesia Type:General  Level of Consciousness: awake, alert  and oriented  Airway & Oxygen Therapy: Patient Spontanous Breathing and Patient connected to nasal cannula oxygen  Post-op Assessment: Report given to RN, Post -op Vital signs reviewed and stable and Patient moving all extremities  Post vital signs: Reviewed and stable  Last Vitals:  Filed Vitals:   12/28/15 1300 12/28/15 1305  BP:  97/66  Pulse: 90 91  Temp:    Resp: 22 24    Complications: No apparent anesthesia complications

## 2015-12-28 NOTE — Progress Notes (Signed)
Patient ID: Ian Carter, male   DOB: [birth year], 65 y.o.   MRN: [id number]  [facility] PROGRESS NOTE  Anish Morado WUJ:[id number] DOB: Jun 09, 1951 DOA: 12/20/2015  PCP: case manager consult to establish PCP  Brief narrative:    65 year old male, supposedly resident of a group home, history of schizophrenia, HTN, HLD, presented to ED with right-sided pleuritic type of chest pain, cough, some dyspnea. In the ED, febrile 102F and chest x-ray with right-sided infiltrate with effusion. CT chest confirmed RLL PNA & moderate R sided pleural effusion. Admitted for community-acquired pneumonia and right-sided pleural effusion. Pulmonology was consulted due to worsening right-sided pleural effusion. IR was consulted and patient is S/P CT-guided right thoracentesis by IR on 3/26. As per pulmonary follow-up, patient has complicated right pleural effusion which is loculated and cannot be drained by catheter alone and hence TCTS was consulted for VATS procedure at [facility] 12/28/15.  Assessment/Plan:    RLL Community-acquired pneumonia with complicated pleural effusion (possibly parapneumonic) - Influenza PCR, Urine Legionella/Strep all negative, blood Cx x 2 neg - Patient completed a week's course of IV ceftriaxone and 4 days of azithromycin. Off antibiotics since 3/28 - CT chest 3/22: RLL consolidation compatible with pneumonia, superimposed moderate-sized right pleural effusion - S/P CT-guided right thoracentesis and placement of drainage catheter by IR on 3/26, fluid culture negative to date - plan for VATS today   Essential hypertension - Controlled on amlodipine. HCTZ held temporarily - reasonably stable so far   Hyperlipidemia - Continue statin  Schizophrenia - stable at this time   Possible stage III chronic kidney disease - Baseline creatinine unknown-may be 1.3-1.4 range - Cr now WNL  - BMP in AM  Leukocytosis - Most likely secondary to pneumonia. No diarrhea  reported. Improving  Anemia of chronic disease  - Stable, no signs of bleeding   DVT prophylaxis - Lovenox SQ  Code Status: Full.  Family Communication:  plan of care discussed with the patient Disposition Plan: Unable to determine, follow clinical progress, need VATS 3/29  IV access:  Peripheral IV  Procedures and diagnostic studies:    Dg Chest 1 View 12/25/2015 Satisfactory post chest tube placement.   Ct Chest Wo Contrast 12/23/2015  Moderately large, partially loculated right pleural effusion which has greatly enlarged from 2 days ago and involves the major and minor fissures. 2. Associated compressive atelectasis as well as right middle and right lower lobe consolidation compatible with pneumonia.   US Renal 12/22/2015  Normal size kidneys without evidence of hydronephrosis. Mild increased cortical echogenicity which can be seen with medical renal disease. Three small left renal cysts.   Ct Image Guided Fluid Drain By Catheter 12/25/2015  Successful CT guided placement of a 12 French all purpose drain catheter into the right pleural space with aspiration of approximately 220 cc of serous, non foul smelling mL of pleural fluid. Samples were sent to the laboratory as requested by the ordering clinical team.   Medical Consultants:  PCCM IR CTS  Other Consultants:  None  IAnti-Infectives:   Azithromycin 3/21 > 3/25 Rocephin 3/21 > 3/27  Debbora Presto, MD  [facility] Pager [phone]  If 7PM-7AM, please contact night-coverage [email or web address] Password [email or web address] 12/28/2015, 6:59 AM   LOS: 7 days   HPI/Subjective: No events overnight.   Objective: Filed Vitals:   12/27/15 0500 12/27/15 1413 12/27/15 2233 12/28/15 0441  BP: 104/66 123/71 115/68 115/68  Pulse: 90 92 89 86  Temp: 98.7 F (37.1 C) 97.5 F (36.4 C) 100  F (37.8 C) 98 F (36.7 C)  TempSrc: Oral Oral Oral Oral  Resp: 20 16 18 18   Height:      Weight:      SpO2: 94% 98% 95% 94%    Intake/Output Summary  (Last 24 hours) at 12/28/15 0659 Last data filed at 12/27/15 1413  Gross per 24 hour  Intake    480 ml  Output      0 ml  Net    480 ml    Exam:   General:  Pt is alert, follows commands appropriately, not in acute distress  Cardiovascular: Regular rate and rhythm, no rubs, no gallops  Respiratory: diminished breath sounds at the right base. R chest drainage catheter connected to pleur-vac arise.   Abdomen: Soft, non tender, non distended, bowel sounds present, no guarding  Extremities: No edema, pulses DP and PT palpable bilaterally  Data Reviewed: Basic Metabolic Panel:  Recent Labs Lab 12/22/15 0350 12/23/15 0352 12/24/15 0353 12/27/15 2140  NA 139 138 142 142  K 4.0 3.5 3.6 4.0  CL 104 107 107 106  CO2 28 23 25 28   GLUCOSE 119* 126* 147* 131*  BUN 20 17 16 13   CREATININE 1.64* 1.31* 1.32* 1.12  CALCIUM 8.4* 8.2* 8.5* 8.7*   Liver Function Tests:  Recent Labs Lab 12/27/15 2140  AST 20  ALT 23  ALKPHOS 115  BILITOT 0.4  PROT 6.2*  ALBUMIN 2.0*   CBC:  Recent Labs Lab 12/22/15 0350 12/23/15 0352 12/24/15 0353 12/26/15 0359 12/27/15 2140  WBC 27.0* 22.0* 17.8* 13.9* 11.6*  HGB 12.1* 11.3* 11.4* 11.5* 11.8*  HCT 34.8* 33.2* 33.1* 33.5* 34.8*  MCV 85.5 86.7 86.2 85.2 87.2  PLT 242 262 280 321 382   Recent Results (from the past 240 hour(s))  Culture, blood (routine x 2)     Status: None   Collection Time: 12/20/15 10:55 PM  Result Value Ref Range Status   Specimen Description BLOOD RIGHT ANTECUBITAL  Final   Special Requests BOTTLES DRAWN AEROBIC AND ANAEROBIC 5 ML  Final   Culture   Final    NO GROWTH 5 DAYS [facility]    Report Status 12/26/2015 FINAL  Final  Culture, blood (routine x 2)     Status: None   Collection Time: 12/20/15 10:56 PM  Result Value Ref Range Status   Specimen Description BLOOD BLOOD LEFT FOREARM  Final   Special Requests BOTTLES DRAWN AEROBIC AND ANAEROBIC 5 ML  Final   Culture   Final     NO GROWTH 5 DAYS [facility]formed at [facility]    Report Status 12/26/2015 FINAL  Final  Urine culture     Status: None   Collection Time: 12/21/15  2:01 AM  Result Value Ref Range Status   Specimen Description URINE, RANDOM  Final   Special Requests NONE  Final   Culture   Final    NO GROWTH 1 DAY [facility] at [facility]    Report Status 12/22/2015 FINAL  Final  Culture, sputum-assessment     Status: None   Collection Time: 12/23/15  6:37 AM  Result Value Ref Range Status   Specimen Description SPUTUM  Final   Special Requests NONE  Final   Sputum evaluation   Final    MICROSCOPIC FINDINGS SUGGEST THAT THIS SPECIMEN IS NOT REPRESENTATIVE OF LOWER RESPIRATORY SECRETIONS. PLEASE RECOLLECT. NOTIFIED RN AT [phone] ON 03.24.17 BY SHUEA    Report Status 12/23/2015 FINAL  Final  Body fluid culture     Status: None (Preliminary result)   Collection Time: 12/25/15 10:14 AM  Result Value Ref Range Status   Specimen Description PLEURAL DRAIN PLACEMENT  Final   Special Requests NONE  Final   Gram Stain   Final    CYTOSPIN SLIDE WBC PRESENT, PREDOMINANTLY PMN NO ORGANISMS SEEN    Culture   Final    NO GROWTH 2 DAYS Performed at [facility]    Report Status PENDING  Incomplete     Scheduled Meds: . amLODipine  5 mg Oral Daily  . aspirin EC  81 mg Oral Daily  . benztropine  0.5 mg Oral BID  . carbamazepine  400 mg Oral QHS  . docusate sodium  100 mg Oral BID  . enoxaparin (LOVENOX) injection  40 mg Subcutaneous Q24H  . haloperidol  20 mg Oral QHS  . loratadine  10 mg Oral Daily  . simvastatin  10 mg Oral QHS  . vancomycin  1,000 mg Intravenous to SS-Proc   Continuous Infusions:

## 2015-12-28 NOTE — Anesthesia Procedure Notes (Addendum)
Procedure Name: Intubation Performed by: Everlene BallsHAYES, CHRISTINE T Pre-anesthesia Checklist: Patient identified, Timeout performed, Emergency Drugs available, Suction available and Patient being monitored Patient Re-evaluated:Patient Re-evaluated prior to inductionOxygen Delivery Method: Circle system utilized Preoxygenation: Pre-oxygenation with 100% oxygen Intubation Type: IV induction Ventilation: Mask ventilation without difficulty Laryngoscope Size: Mac and 4 Grade View: Grade II Endobronchial tube: Left and Double lumen EBT and 39 Fr Number of attempts: 1 Airway Equipment and Method: Stylet Placement Confirmation: ETT inserted through vocal cords under direct vision,  breath sounds checked- equal and bilateral and positive ETCO2 Tube secured with: Tape Dental Injury: Teeth and Oropharynx as per pre-operative assessment    RIJ CVP Dual Lumen:1300-1315: The patient was identified and consent obtained.  TO was performed, and full barrier precautions were used.  The skin was anesthetized with lidocaine.  Once the vein was located with the 22 ga. needle using ultrasound guidance , the wire was inserted into the vein.  The wire location was confirmed with ultrasound.  The tissue was dilated and the catheter was carefully inserted, then sutured in place. A dressing was applied. The patient tolerated the procedure well.

## 2015-12-28 NOTE — Brief Op Note (Addendum)
12/20/2015 - 12/28/2015  4:34 PM  PATIENT:  Eddie Candleaynard Hermans  65 y.o. male  PRE-OPERATIVE DIAGNOSIS:  RIGHT PLEURAL EFFUSION  POST-OPERATIVE DIAGNOSIS:  RIGHT PLEURAL EFFUSION/ EARLY ORGANIZING EMPYEMA  PROCEDURE:   RIGHT VIDEO ASSISTED THORACOSCOPY DRAINAGE OF EMPYEMA VISCERAL AND PARIETAL PLEURAL DECORTICATION  SURGEON:  Surgeon(s) and Role:    * Loreli SlotSteven C Siearra Amberg, MD - Primary  PHYSICIAN ASSISTANT: WAYNE GOLD PA-C  ANESTHESIA:   general  EBL:  Total I/O In: 1200 [I.V.:1200] Out: 1410 [Urine:810; Blood:600]  BLOOD ADMINISTERED:none  DRAINS: 1 Chest Tube(s) in the RIGHT HEMITHORAX and (2) Blake drain(s) in the RIGHT HEMITHORAX   LOCAL MEDICATIONS USED:  NONE  SPECIMEN:  Source of Specimen:  PLEURAL PEEL, PLEURAL EFFUSION  DISPOSITION OF SPECIMEN:  MICRO  COUNTS:  YES  PLAN OF CARE: Admit to inpatient   PATIENT DISPOSITION:  PACU - hemodynamically stable.   Delay start of Pharmacological VTE agent (>24hrs) due to surgical blood loss or risk of bleeding: yes  FINDINGS- early organizing empyema with extensive pleural peel

## 2015-12-28 NOTE — Op Note (Signed)
NAMEDMANI, MIZER            ACCOUNT NO.:  [id number]  MEDICAL RECORD NO.:  [id number]  LOCATION:  2S07C                        FACILITY:  [facility]  PHYSICIAN:  Salvatore Decent. Dorris Fetch, M.D.DATE OF BIRTH:  1951-09-20  DATE OF PROCEDURE:  12/28/2015 DATE OF DISCHARGE:                              OPERATIVE REPORT   PREOPERATIVE DIAGNOSIS:  Loculated right pleural effusion.  POSTOPERATIVE DIAGNOSIS:  Early organizing empyema.  PROCEDURE:   Right video-assisted thoracoscopy, Drainage of empyema, Visceral and parietal pleural decortication.  SURGEON:  Salvatore Decent. Dorris Fetch, M.D.  ASSISTANT:  Rowe Clack, P.A.-C.  ANESTHESIA:  General.  FINDINGS:  Large early organizing empyema with extensive visceral and parietal pleural peel with relatively little free fluid.  CLINICAL NOTE:  Mr. Stanke is a 65 year old man with a history of seizures and schizophrenia who was admitted with a 1-week history of right chest pain.  He was febrile.  Chest x-ray showed pneumonia with a pleural effusion.  He defervesced but his effusion worsened over time. A percutaneous drain was placed but had minimal effect.  CT showed a complex loculated effusion.  He was advised to undergo right VATS with drainage of the effusion and visceral and parietal pleural decortication.  The patient did seem to understand the issues involved. I also discussed the issues with the patient's brother, Cadden Elizondo. He understood the risks and benefits as well.  OPERATIVE NOTE:  Mr. Scales was brought to the preoperative holding area on December 28, 2015.  Anesthesia placed a central line and arterial blood pressure monitoring line.  He was taken to the operating room, anesthetized, and intubated with a double-lumen endotracheal tube.  A Foley catheter was placed.  Sequential compression devices were placed on the calves for DVT prophylaxis.  Intravenous vancomycin was administered.  He was placed in a left lateral  decubitus position, and the right chest was prepped and draped in usual sterile fashion.  Single lung ventilation of the left lung was initiated and was tolerated well throughout the procedure.  An incision was made in the seventh intercostal space in the midaxillary line and was carried through the skin and subcutaneous tissue.  The chest was entered bluntly using a hemostat.  A finger was advanced into the chest.  There were adhesions noted.  A port was placed but visualization was limited.  A small incision approximately 4 cm in length was made in the fifth interspace anterolaterally, and the chest was entered.  Blunt dissection was used to connect the 2 incisions, and a port was placed through the original port incision.  There was now adequate visualization.  There was a complex pleural effusion consistent with an early organizing empyema with extensive gelatinous debris. There was minimal free fluid, and there was no frankly purulent fluid.  A second port incision was made anterior to the first for additional instrumentation.  The lung was freed up from the chest wall circumferentially.  The tissue was very friable.  There were extensive collections of gelatinous material within the fissures.  The fissures were cleared.  Decortication was started on the right upper lobe which had the least involvement.  The right middle lobe also was relatively easily decorticated.  The lower  lobe was densely adherent to the diaphragm and the posterolateral chest wall at the base.  This was freed up, but it took a  considerable period of time.  The visceral decortication of the lower lobe then was performed, and a test inflation showed good expansion of all 3 lobes.  The parietal pleural decortication then was performed as well.  The chest was copiously irrigated with warm saline on multiple occasions.  Three chest tubes were placed. Two 32-French Blake drains were placed, 1 posteriorly along the  paravertebral space and 1 along the diaphragm.  A 28-French chest tube was placed anteriorly and directed to the apex.  The lung was re-inflated.  The incision was closed with a running #1 Vicryl suture, 2-0 Vicryl subcutaneous suture and a 3-0 Vicryl subcuticular suture.  All sponge, needle, and instrument counts were correct at the end of the procedure.  The patient was taken from the operating room to the postanesthetic care unit extubated and in good condition.     Salvatore DecentSteven C. Dorris FetchHendrickson, M.D.     SCH/MEDQ  D:  12/28/2015  T:  12/28/2015  Job:  [id number]

## 2015-12-29 ENCOUNTER — Encounter (HOSPITAL_COMMUNITY): Payer: Self-pay | Admitting: Thoracic Surgery (Cardiothoracic Vascular Surgery)

## 2015-12-29 ENCOUNTER — Inpatient Hospital Stay (HOSPITAL_COMMUNITY): Payer: Medicare Other

## 2015-12-29 DIAGNOSIS — J869 Pyothorax without fistula: Secondary | ICD-10-CM

## 2015-12-29 LAB — CBC
HCT: 28.8 % — ABNORMAL LOW (ref 39.0–52.0)
HEMOGLOBIN: 9.8 g/dL — AB (ref 13.0–17.0)
MCH: 30.1 pg (ref 26.0–34.0)
MCHC: 34 g/dL (ref 30.0–36.0)
MCV: 88.3 fL (ref 78.0–100.0)
Platelets: 320 10*3/uL (ref 150–400)
RBC: 3.26 MIL/uL — ABNORMAL LOW (ref 4.22–5.81)
RDW: 12.8 % (ref 11.5–15.5)
WBC: 19.9 10*3/uL — ABNORMAL HIGH (ref 4.0–10.5)

## 2015-12-29 LAB — BASIC METABOLIC PANEL
Anion gap: 8 (ref 5–15)
BUN: 14 mg/dL (ref 6–20)
CALCIUM: 8.1 mg/dL — AB (ref 8.9–10.3)
CHLORIDE: 109 mmol/L (ref 101–111)
CO2: 23 mmol/L (ref 22–32)
CREATININE: 1.15 mg/dL (ref 0.61–1.24)
GFR calc Af Amer: >60 mL/min (ref >60)
GFR calc non Af Amer: >60 mL/min (ref >60)
Glucose, Bld: 154 mg/dL — ABNORMAL HIGH (ref 65–99)
Potassium: 4.3 mmol/L (ref 3.5–5.1)
SODIUM: 140 mmol/L (ref 135–145)

## 2015-12-29 LAB — POCT I-STAT 3, ART BLOOD GAS (G3+)
ACID-BASE DEFICIT: 3 mmol/L — AB (ref 0.0–2.0)
BICARBONATE: 21.8 meq/L (ref 20.0–24.0)
O2 Saturation: 91 %
TCO2: 23 mmol/L (ref 0–100)
pCO2 arterial: 38.3 mmHg (ref 35.0–45.0)
pH, Arterial: 7.364 (ref 7.350–7.450)
pO2, Arterial: 64 mmHg — ABNORMAL LOW (ref 80.0–100.0)

## 2015-12-29 LAB — BODY FLUID CULTURE: CULTURE: NO GROWTH

## 2015-12-29 LAB — MRSA PCR SCREENING: MRSA BY PCR: NEGATIVE

## 2015-12-29 MED ORDER — ENOXAPARIN SODIUM 40 MG/0.4ML ~~LOC~~ SOLN
40.0000 mg | SUBCUTANEOUS | Status: DC
Start: 2015-12-29 — End: 2016-01-03
  Administered 2015-12-29 – 2016-01-03 (×6): 40 mg via SUBCUTANEOUS
  Filled 2015-12-29 (×6): qty 0.4

## 2015-12-29 NOTE — Progress Notes (Signed)
1 Day Post-Op Procedure(s) (LRB): RIGHT VIDEO ASSISTED THORACOSCOPY (VATS)/DECORTICATION (Right) DRAINAGE OF RIGHT  PLEURAL EFFUSION (Right) Subjective: Some soreness  Objective: Vital signs in last 24 hours: Temp:  [96 F (35.6 C)-98.7 F (37.1 C)] 98.4 F (36.9 C) (03/30 0801) Pulse Rate:  [87-113] 109 (03/30 0700) Cardiac Rhythm:  [-] Sinus tachycardia (03/30 0700) Resp:  [15-94] 27 (03/30 0700) BP: (97-123)/(64-108) 119/80 mmHg (03/30 0700) SpO2:  [91 %-100 %] 100 % (03/30 0801) Arterial Line BP: (91-142)/(59-83) 142/73 mmHg (03/30 0600)  Hemodynamic parameters for last 24 hours:    Intake/Output from previous day: 03/29 0701 - 03/30 0700 In: 4275 [I.V.:3675; IV Piggyback:600] Out: 2590 [Urine:1640; Blood:600; Chest Tube:350] Intake/Output this shift:    General appearance: alert, cooperative and no distress Neurologic: intact Heart: regular rate and rhythm Lungs: diminished breath sounds bibasilar Abdomen: normal findings: soft, non-tender no air leak  Lab Results:  Recent Labs  12/27/15 2140 12/29/15 0420  WBC 11.6* 19.9*  HGB 11.8* 9.8*  HCT 34.8* 28.8*  PLT 382 320   BMET:  Recent Labs  12/27/15 2140 12/29/15 0420  NA 142 140  K 4.0 4.3  CL 106 109  CO2 28 23  GLUCOSE 131* 154*  BUN 13 14  CREATININE 1.12 1.15  CALCIUM 8.7* 8.1*    PT/INR:  Recent Labs  12/27/15 2140  LABPROT 14.8  INR 1.14   ABG    Component Value Date/Time   PHART 7.364 12/29/2015 0421   HCO3 21.8 12/29/2015 0421   TCO2 23 12/29/2015 0421   ACIDBASEDEF 3.0* 12/29/2015 0421   O2SAT 91.0 12/29/2015 0421   CBG (last 3)  No results for input(s): GLUCAP in the last 72 hours.  Assessment/Plan: S/P Procedure(s) (LRB): RIGHT VIDEO ASSISTED THORACOSCOPY (VATS)/DECORTICATION (Right) DRAINAGE OF RIGHT  PLEURAL EFFUSION (Right) -  POD # 1 R VATS decortication for empyema On vanco and zosyn Keep CT to suction SCD for DVT prophylaxis- add  enoxaparin Mobilize Advance diet   LOS: 8 days    Loreli SlotSteven C Hendrickson 12/29/2015

## 2015-12-29 NOTE — Progress Notes (Signed)
Patient ID: Ian Carter, male   DOB: January 28, 1951, 65 y.o.   MRN: [id number]  [facility] PROGRESS NOTE  Ian Carter WUJ:[id number] DOB: January 28, 1951 DOA: 12/20/2015  PCP: case manager consult to establish PCP  Brief narrative:    65 year old male, supposedly resident of a group home, history of schizophrenia, HTN, HLD, presented to ED with right-sided pleuritic type of chest pain, cough, some dyspnea. In the ED, febrile 102F and chest x-ray with right-sided infiltrate with effusion. CT chest confirmed RLL PNA & moderate R sided pleural effusion. Admitted for community-acquired pneumonia and right-sided pleural effusion. Pulmonology was consulted due to worsening right-sided pleural effusion. IR was consulted and patient is S/P CT-guided right thoracentesis by IR on 3/26. As per pulmonary follow-up, patient has complicated right pleural effusion which is loculated and cannot be drained by catheter alone and hence [facility].  Assessment/Plan:    RLL Community-acquired pneumonia with complicated pleural effusion / empyema  - Influenza PCR, Urine Legionella/Strep all negative, blood Cx x 2 neg - Patient completed a week's course of IV ceftriaxone and 4 days of azithromycin. Off antibiotics since 3/28 - CT chest 3/22: RLL consolidation compatible with pneumonia, superimposed moderate-sized right pleural effusion - S/P CT-guided right thoracentesis and placement of drainage catheter by IR on 3/26, fluid culture negative to date - S/P R VATS decortication for empyema, post op day #1 - continue vancomycin and zosyn   Leukocytosis, ? SIRS - pt meets criteria for SIRS - post op related - already on vancomycin and zosyn per CTS team - currently afebrile, monitor CBC  Essential hypertension - Controlled on amlodipine. HCTZ held temporarily - reasonably stable so far   Hyperlipidemia - Continue statin  Schizophrenia - stable  at this time   Stage III chronic kidney disease - Baseline creatinine unknown-may be 1.3-1.4 range - Cr now WNL  - BMP in AM  Anemia of chronic disease  - Stable, no signs of bleeding   DVT prophylaxis - Lovenox SQ  Code Status: Full.  Family Communication:  plan of care discussed with the patient Disposition Plan: Unable to determine, follow clinical progress, possibly by 4/3  IV access:  Peripheral IV  Procedures and diagnostic studies:    Dg Chest 1 View 12/25/2015 Satisfactory post chest tube placement.   Ct Chest Wo Contrast 12/23/2015  Moderately large, partially loculated right pleural effusion which has greatly enlarged from 2 days ago and involves the major and minor fissures. 2. Associated compressive atelectasis as well as right middle and right lower lobe consolidation compatible with pneumonia.   [place] Renal 12/22/2015  Normal size kidneys without evidence of hydronephrosis. Mild increased cortical echogenicity which can be seen with medical renal disease. Three small left renal cysts.   Ct Image Guided Fluid Drain By Catheter 12/25/2015  Successful CT guided placement of a 12 French all purpose drain catheter into the right pleural space with aspiration of approximately 220 cc of serous, non foul smelling mL of pleural fluid. Samples were sent to the laboratory as requested by the ordering clinical team.   Medical Consultants:  PCCM IR CTS  Other Consultants:  None  IAnti-Infectives:   Azithromycin 3/21 > 3/25 Rocephin 3/21 > 3/27  Debbora PrestoMAGICK-Jennavie Martinek, MD  [facility] Pager [phone]  If 7PM-7AM, please contact night-coverage [email or web address] Password [email or web address] 12/29/2015, 5:06 PM   LOS: 8 days   HPI/Subjective: No events overnight.   Objective: Filed Vitals:   12/29/15 1400 12/29/15 1500  12/29/15 1600 12/29/15 1605  BP: 106/76 106/81 107/76   Pulse: 105 102 102   Temp:    98.7 F (37.1 C)  TempSrc:    Oral  Resp: 25 35 97   Height:      Weight:      SpO2:  96% 95% 92% 98%    Intake/Output Summary (Last 24 hours) at 12/29/15 1706 Last data filed at 12/29/15 1600  Gross per 24 hour  Intake 3452.5 ml  Output   1880 ml  Net 1572.5 ml    Exam:   General:  Pt is somnolent but easy to awake, not in acute distress  Cardiovascular: Regular rate and rhythm, no rubs, no gallops  Respiratory: diminished breath sounds at the right base.   Abdomen: Soft, non tender, non distended, bowel sounds present, no guarding  Extremities: No edema, pulses DP and PT palpable bilaterally  Data Reviewed: Basic Metabolic Panel:  Recent Labs Lab 12/23/15 0352 12/24/15 0353 12/27/15 2140 12/29/15 0420  NA 138 142 142 140  K 3.5 3.6 4.0 4.3  CL 107 107 106 109  CO2 GLUCOSE 126* 147* 131* 154*  BUN CREATININE 1.31* 1.32* 1.12 1.15  CALCIUM 8.2* 8.5* 8.7* 8.1*   Liver Function Tests:  Recent Labs Lab 12/27/15 2140  AST 20  ALT 23  ALKPHOS 115  BILITOT 0.4  PROT 6.2*  ALBUMIN 2.0*   CBC:  Recent Labs Lab 12/23/15 0352 12/24/15 0353 12/26/15 0359 12/27/15 2140 12/29/15 0420  WBC 22.0* 17.8* 13.9* 11.6* 19.9*  HGB 11.3* 11.4* 11.5* 11.8* 9.8*  HCT 33.2* 33.1* 33.5* 34.8* 28.8*  MCV 86.7 86.2 85.2 87.2 88.3  PLT 262 280 321 382 320   Recent Results (from the past 240 hour(s))  Culture, blood (routine x 2)     Status: None   Collection Time: 12/20/15 10:55 PM  Result Value Ref Range Status   Specimen Description BLOOD RIGHT ANTECUBITAL  Final   Special Requests BOTTLES DRAWN AEROBIC AND ANAEROBIC 5 ML  Final   Culture   Final    NO GROWTH 5 DAYS Performed at [facility]    Report Status 12/26/2015 FINAL  Final  Culture, blood (routine x 2)     Status: None   Collection Time: 12/20/15 10:56 PM  Result Value Ref Range Status   Specimen Description BLOOD BLOOD LEFT FOREARM  Final   Special Requests BOTTLES DRAWN AEROBIC AND ANAEROBIC 5 ML  Final   Culture   Final    NO GROWTH 5  DAYS Performed at [facility]    Report Status 12/26/2015 FINAL  Final  Urine culture     Status: None   Collection Time: 12/21/15  2:01 AM  Result Value Ref Range Status   Specimen Description URINE, RANDOM  Final   Special Requests NONE  Final   Culture   Final    NO GROWTH 1 DAY Performed at [facility]    Report Status 12/22/2015 FINAL  Final  Culture, sputum-assessment     Status: None   Collection Time: 12/23/15  6:37 AM  Result Value Ref Range Status   Specimen Description SPUTUM  Final   Special Requests NONE  Final   Sputum evaluation   Final    MICROSCOPIC FINDINGS SUGGEST THAT THIS SPECIMEN IS NOT REPRESENTATIVE OF LOWER RESPIRATORY SECRETIONS. PLEASE RECOLLECT. NOTIFIED RN AT [phone] ON 03.24.17 BY SHUEA    Report Status 12/23/2015  FINAL  Final  Body fluid culture     Status: None   Collection Time: 12/25/15 10:14 AM  Result Value Ref Range Status   Specimen Description PLEURAL DRAIN PLACEMENT  Final   Special Requests NONE  Final   Gram Stain   Final    CYTOSPIN SLIDE WBC PRESENT, PREDOMINANTLY PMN NO ORGANISMS SEEN    Culture   Final    NO GROWTH 3 DAYS Performed at [facility]    Report Status 12/29/2015 FINAL  Final  Culture, body fluid-bottle     Status: None (Preliminary result)   Collection Time: 12/28/15  2:38 PM  Result Value Ref Range Status   Specimen Description FLUID RIGHT PLEURAL  Final   Special Requests   Final    PATIENT ON FOLLOWING CEFTRIAXONE  ZITHROMAX AND VANC   Culture NO GROWTH < 24 HOURS  Final   Report Status PENDING  Incomplete  Gram stain     Status: None   Collection Time: 12/28/15  2:38 PM  Result Value Ref Range Status   Specimen Description FLUID RIGHT PLEURAL  Final   Special Requests   Final    PATIENT ON FOLLOWING CEFTRIAXONE ZITHROMAX AND VANC   Gram Stain   Final    FEW WBC PRESENT,BOTH PMN AND MONONUCLEAR NO ORGANISMS SEEN    Report Status 12/28/2015 FINAL  Final  Tissue culture      Status: None (Preliminary result)   Collection Time: 12/28/15  2:41 PM  Result Value Ref Range Status   Specimen Description TISSUE RIGHT LUNG  Final   Special Requests   Final    RIGHT VICERAL PEEL PATIENT ON FOLLOWING VANC ZITHROMAX AND CEFTRIAXONE   Gram Stain   Final    FEW WBC PRESENT, PREDOMINANTLY MONONUCLEAR NO ORGANISMS SEEN Performed at [facility]    Culture PENDING  Incomplete   Report Status PENDING  Incomplete  MRSA PCR Screening     Status: None   Collection Time: 12/28/15 11:00 PM  Result Value Ref Range Status   MRSA by PCR NEGATIVE NEGATIVE Final    Comment:        The GeneXpert MRSA Assay (FDA approved for NASAL specimens only), is one component of a comprehensive MRSA colonization surveillance program. It is not intended to diagnose MRSA infection nor to guide or monitor treatment for MRSA infections.      Scheduled Meds: . acetaminophen  1,000 mg Oral 4 times per day   Or  . acetaminophen (TYLENOL) oral liquid 160 mg/5 mL  1,000 mg Oral 4 times per day  . albuterol  2.5 mg Nebulization Q4H while awake  . amLODipine  5 mg Oral Daily  . aspirin EC  81 mg Oral Daily  . benztropine  0.5 mg Oral BID  . bisacodyl  10 mg Oral Daily  . carbamazepine  400 mg Oral QHS  . enoxaparin (LOVENOX) injection  40 mg Subcutaneous Q24H  . fentaNYL   Intravenous 6 times per day  . haloperidol  20 mg Oral QHS  . loratadine  10 mg Oral Daily  . pantoprazole  40 mg Oral QAC breakfast  . piperacillin-tazobactam (ZOSYN)  IV  3.375 g Intravenous 3 times per day  . senna-docusate  1 tablet Oral QHS  . simvastatin  10 mg Oral QHS  . vancomycin  1,000 mg Intravenous Q12H   Continuous Infusions: . dextrose 5 % and 0.9% NaCl 50 mL/hr at 12/29/15 1300

## 2015-12-29 NOTE — Progress Notes (Signed)
TCTS BRIEF SICU PROGRESS NOTE  1 Day Post-Op  S/P Procedure(s) (LRB): RIGHT VIDEO ASSISTED THORACOSCOPY (VATS)/DECORTICATION (Right) DRAINAGE OF RIGHT  PLEURAL EFFUSION (Right)   Stable day Adequate analgesia Breathing comfortably  Plan: Continue current plan  Purcell Nailslarence H Terrell Shimko, MD 12/29/2015 7:31 PM

## 2015-12-29 NOTE — Care Management Important Message (Signed)
Important Message  Patient Details  Name: Ian Carter MRN: [id number] Date of Birth: 1951/04/01   Medicare Important Message Given:  Yes    Helyne Genther Abena 12/29/2015, 2:00 PM

## 2015-12-30 ENCOUNTER — Inpatient Hospital Stay (HOSPITAL_COMMUNITY): Payer: Medicare Other

## 2015-12-30 LAB — ACID FAST SMEAR (AFB)
ACID FAST SMEAR - AFSCU2: NEGATIVE
ACID FAST SMEAR - AFSCU2: NEGATIVE

## 2015-12-30 LAB — CBC
HEMATOCRIT: 32 % — AB (ref 39.0–52.0)
Hemoglobin: 10.4 g/dL — ABNORMAL LOW (ref 13.0–17.0)
MCH: 28.7 pg (ref 26.0–34.0)
MCHC: 32.5 g/dL (ref 30.0–36.0)
MCV: 88.4 fL (ref 78.0–100.0)
Platelets: 400 10*3/uL (ref 150–400)
RBC: 3.62 MIL/uL — AB (ref 4.22–5.81)
RDW: 13 % (ref 11.5–15.5)
WBC: 22.6 10*3/uL — AB (ref 4.0–10.5)

## 2015-12-30 LAB — POCT I-STAT 3, ART BLOOD GAS (G3+)
Acid-base deficit: 1 mmol/L (ref 0.0–2.0)
BICARBONATE: 26.6 meq/L — AB (ref 20.0–24.0)
O2 Saturation: 94 %
PCO2 ART: 53.4 mmHg — AB (ref 35.0–45.0)
PO2 ART: 78 mmHg — AB (ref 80.0–100.0)
Patient temperature: 36.9
TCO2: 28 mmol/L (ref 0–100)
pH, Arterial: 7.304 — ABNORMAL LOW (ref 7.350–7.450)

## 2015-12-30 LAB — ACID FAST SMEAR (AFB, MYCOBACTERIA): Acid Fast Smear: NEGATIVE

## 2015-12-30 LAB — COMPREHENSIVE METABOLIC PANEL
ALT: 25 U/L (ref 17–63)
AST: 25 U/L (ref 15–41)
Albumin: 2 g/dL — ABNORMAL LOW (ref 3.5–5.0)
Alkaline Phosphatase: 91 U/L (ref 38–126)
Anion gap: 7 (ref 5–15)
BILIRUBIN TOTAL: 0.6 mg/dL (ref 0.3–1.2)
BUN: 17 mg/dL (ref 6–20)
CALCIUM: 8.6 mg/dL — AB (ref 8.9–10.3)
CO2: 26 mmol/L (ref 22–32)
CREATININE: 1.79 mg/dL — AB (ref 0.61–1.24)
Chloride: 107 mmol/L (ref 101–111)
GFR calc Af Amer: 44 mL/min — ABNORMAL LOW (ref >60)
GFR, EST NON AFRICAN AMERICAN: 38 mL/min — AB (ref >60)
Glucose, Bld: 144 mg/dL — ABNORMAL HIGH (ref 65–99)
Potassium: 4.1 mmol/L (ref 3.5–5.1)
Sodium: 140 mmol/L (ref 135–145)
TOTAL PROTEIN: 6.1 g/dL — AB (ref 6.5–8.1)

## 2015-12-30 LAB — POCT I-STAT 4, (NA,K, GLUC, HGB,HCT)
Glucose, Bld: 145 mg/dL — ABNORMAL HIGH (ref 65–99)
HCT: 38 % — ABNORMAL LOW (ref 39.0–52.0)
Hemoglobin: 12.9 g/dL — ABNORMAL LOW (ref 13.0–17.0)
Potassium: 3.9 mmol/L (ref 3.5–5.1)
SODIUM: 142 mmol/L (ref 135–145)

## 2015-12-30 MED ORDER — ALBUTEROL SULFATE (2.5 MG/3ML) 0.083% IN NEBU
2.5000 mg | INHALATION_SOLUTION | Freq: Four times a day (QID) | RESPIRATORY_TRACT | Status: DC
  Administered 2015-12-30 (×4): 2.5 mg via RESPIRATORY_TRACT
  Filled 2015-12-30 (×3): qty 3

## 2015-12-30 MED ORDER — AZITHROMYCIN 500 MG PO TABS
500.0000 mg | ORAL_TABLET | Freq: Every day | ORAL | Status: DC
  Administered 2015-12-30 – 2015-12-31 (×2): 500 mg via ORAL
  Filled 2015-12-30 (×5): qty 1

## 2015-12-30 MED ORDER — OXYCODONE HCL 5 MG PO TABS
5.0000 mg | ORAL_TABLET | ORAL | Status: DC | PRN
  Administered 2015-12-30 – 2016-01-02 (×8): 5 mg via ORAL
  Filled 2015-12-30 (×8): qty 1

## 2015-12-30 MED ORDER — DEXTROSE 5 % IV SOLN
2.0000 g | INTRAVENOUS | Status: DC
  Administered 2015-12-30 – 2016-01-01 (×3): 2 g via INTRAVENOUS
  Filled 2015-12-30 (×3): qty 2

## 2015-12-30 NOTE — Progress Notes (Signed)
2 Days Post-Op Procedure(s) (LRB): RIGHT VIDEO ASSISTED THORACOSCOPY (VATS)/DECORTICATION (Right) DRAINAGE OF RIGHT  PLEURAL EFFUSION (Right) Subjective: C/o incisional pain  Objective: Vital signs in last 24 hours: Temp:  [98.3 F (36.8 C)-98.9 F (37.2 C)] 98.6 F (37 C) (03/31 0400) Pulse Rate:  [94-148] 108 (03/31 0700) Cardiac Rhythm:  [-] Sinus tachycardia (03/31 0700) Resp:  [17-97] 18 (03/31 0700) BP: (102-130)/(70-84) 117/73 mmHg (03/31 0700) SpO2:  [88 %-100 %] 96 % (03/31 0700) Arterial Line BP: (66)/(59) 66/59 mmHg (03/30 0800)  Hemodynamic parameters for last 24 hours:    Intake/Output from previous day: 03/30 0701 - 03/31 0700 In: 2077.5 [P.O.:340; I.V.:1187.5; IV Piggyback:550] Out: 1865 [Urine:1655; Chest Tube:210] Intake/Output this shift:    General appearance: alert, cooperative and no distress Neurologic: intact Heart: regular rate and rhythm Lungs: diminished breath sounds bibasilar no air leak  Lab Results:  Recent Labs  12/29/15 0420 12/30/15 0300  WBC 19.9* 22.6*  HGB 9.8* 10.4*  HCT 28.8* 32.0*  PLT 320 400   BMET:  Recent Labs  12/29/15 0420 12/30/15 0300  NA 140 140  K 4.3 4.1  CL 109 107  CO2 23 26  GLUCOSE 154* 144*  BUN 14 17  CREATININE 1.15 1.79*  CALCIUM 8.1* 8.6*    PT/INR:  Recent Labs  12/27/15 2140  LABPROT 14.8  INR 1.14   ABG    Component Value Date/Time   PHART 7.364 12/29/2015 0421   HCO3 21.8 12/29/2015 0421   TCO2 23 12/29/2015 0421   ACIDBASEDEF 3.0* 12/29/2015 0421   O2SAT 91.0 12/29/2015 0421   CBG (last 3)  No results for input(s): GLUCAP in the last 72 hours.  Assessment/Plan: S/P Procedure(s) (LRB): RIGHT VIDEO ASSISTED THORACOSCOPY (VATS)/DECORTICATION (Right) DRAINAGE OF RIGHT  PLEURAL EFFUSION (Right) Plan for transfer to step-down: see transfer orders  CV- stable  RESP- s/p decortication, dc anterior CT, remaining tubes to water seal  Add flutter valve, change albuterol to  QID  RENAL- creatinine up from 1.15 to 1.79- will stop vanco and zosyn, keep Foley to monitor UO  ID- cultures negative so far- resume ceftriaxone and zithromax for community acquired pneumonia  ENDO_ CBG mildly elevated  DVT prophylaxis- enoxaparin + SCD  ambulate   LOS: 9 days    Loreli SlotSteven C Laityn Bensen 12/30/2015

## 2015-12-30 NOTE — Progress Notes (Signed)
Patient ID: Ian Carter, male   DOB: 11/23/1950, 65 y.o.   MRN: [id number]  [facility] PROGRESS NOTE  Ian Carter [id number]:[id number] DOB: 04/22/1951 DOA: 12/20/2015  PCP: case manager consult to establish PCP  Brief narrative:    65 year old male, supposedly resident of a group home, history of schizophrenia, HTN, HLD, presented to ED with right-sided pleuritic type of chest pain, cough, some dyspnea. In the ED, febrile 102F and chest x-ray with right-sided infiltrate with effusion. CT chest confirmed RLL PNA & moderate R sided pleural effusion. Admitted for community-acquired pneumonia and right-sided pleural effusion. Pulmonology was consulted due to worsening right-sided pleural effusion. IR was consulted and patient is S/P CT-guided right thoracentesis by IR on 3/26. As per pulmonary follow-up, patient has complicated right pleural effusion which is loculated and cannot be drained by catheter alone and hence TCTS was consulted for VATS procedure at [facility] 12/28/15.  Assessment/Plan:    RLL Community-acquired pneumonia with complicated pleural effusion / empyema  - Influenza PCR, Urine Legionella/Strep all negative, blood Cx x 2 neg - Patient completed a week's course of IV ceftriaxone and 4 days of azithromycin. Off antibiotics since 3/28 - CT chest 3/22: RLL consolidation compatible with pneumonia, superimposed moderate-sized right pleural effusion - S/P CT-guided right thoracentesis and placement of drainage catheter by IR on 3/26, fluid culture negative to date - S/P R VATS decortication for empyema, post op day #2 - transitioned from vanc and zosyn to zithro and rocephin - close monitoring of WBC   Leukocytosis, ? SIRS - pt meets criteria for SIRS - post op related - already on ABX as noted above  - currently afebrile, monitor CBC  Essential hypertension - Controlled on amlodipine. HCTZ held temporarily - reasonably stable so far   Hyperlipidemia -  Continue statin  Schizophrenia - stable at this time   Stage III chronic kidney disease - Baseline creatinine unknown-may be 1.3-1.4 range - Cr now up, monitor urine output  - BMP in AM  Anemia of chronic disease  - Stable, no signs of bleeding   DVT prophylaxis - Lovenox SQ  Code Status: Full.  Family Communication:  plan of care discussed with the patient Disposition Plan: Unable to determine, follow clinical progress, possibly by 4/3  IV access:  Peripheral IV  Procedures and diagnostic studies:    Dg Chest 1 View 12/25/2015 Satisfactory post chest tube placement.   Ct Chest Wo Contrast 12/23/2015  Moderately large, partially loculated right pleural effusion which has greatly enlarged from 2 days ago and involves the major and minor fissures. 2. Associated compressive atelectasis as well as right middle and right lower lobe consolidation compatible with pneumonia.   US Renal 12/22/2015  Normal size kidneys without evidence of hydronephrosis. Mild increased cortical echogenicity which can be seen with medical renal disease. Three small left renal cysts.   Ct Image Guided Fluid Drain By Catheter 12/25/2015  Successful CT guided placement of a 12 French all purpose drain catheter into the right pleural space with aspiration of approximately 220 cc of serous, non foul smelling mL of pleural fluid. Samples were sent to the laboratory as requested by the ordering clinical team.   Medical Consultants:  PCCM IR CTS  Other Consultants:  None  IAnti-Infectives:   Azithromycin 3/21 > 3/25, 3/31 --> Rocephin 3/21 > 3/27, 3/31 -->  Debbora Presto, MD  [facility] Pager [phone]  If 7PM-7AM, please contact night-coverage [email or web address] Password [email or web address] 12/30/2015, 4:36 PM   LOS: 9 days  HPI/Subjective: No events overnight.   Objective: Filed Vitals:   12/30/15 1500 12/30/15 1537 12/30/15 1600 12/30/15 1631  BP: 105/76  114/69   Pulse: 99     Temp:  98.3 F (36.8 C)     TempSrc:  Oral    Resp: 23   18  Height:      Weight:      SpO2: 98% 99% 93% 99%    Intake/Output Summary (Last 24 hours) at 12/30/15 1636 Last data filed at 12/30/15 1500  Gross per 24 hour  Intake   1830 ml  Output   2085 ml  Net   -255 ml    Exam:   General:  Pt is somnolent but easy to awake, not in acute distress  Cardiovascular: Regular rate and rhythm, no rubs, no gallops  Respiratory: diminished breath sounds at the right base.   Abdomen: Soft, non tender, non distended, bowel sounds present, no guarding  Extremities: No edema, pulses DP and PT palpable bilaterally  Data Reviewed: Basic Metabolic Panel:  Recent Labs Lab 12/24/15 0353 12/27/15 2140 12/29/15 0420 12/30/15 0300  NA 142 142 140 140  K 3.6 4.0 4.3 4.1  CL 107 106 109 107  CO2 25 28 23 26   GLUCOSE 147* 131* 154* 144*  BUN 16 13 14 17   CREATININE 1.32* 1.12 1.15 1.79*  CALCIUM 8.5* 8.7* 8.1* 8.6*   Liver Function Tests:  Recent Labs Lab 12/27/15 2140 12/30/15 0300  AST 20 25  ALT 23 25  ALKPHOS 115 91  BILITOT 0.4 0.6  PROT 6.2* 6.1*  ALBUMIN 2.0* 2.0*   CBC:  Recent Labs Lab 12/24/15 0353 12/26/15 0359 12/27/15 2140 12/29/15 0420 12/30/15 0300  WBC 17.8* 13.9* 11.6* 19.9* 22.6*  HGB 11.4* 11.5* 11.8* 9.8* 10.4*  HCT 33.1* 33.5* 34.8* 28.8* 32.0*  MCV 86.2 85.2 87.2 88.3 88.4  PLT 280 321 382 320 400   Recent Results (from the past 240 hour(s))  Culture, blood (routine x 2)     Status: None   Collection Time: 12/20/15 10:55 PM  Result Value Ref Range Status   Specimen Description BLOOD RIGHT ANTECUBITAL  Final   Special Requests BOTTLES DRAWN AEROBIC AND ANAEROBIC 5 ML  Final   Culture   Final    NO GROWTH 5 DAYS Performed at [facility]    Report Status 12/26/2015 FINAL  Final  Culture, blood (routine x 2)     Status: None   Collection Time: 12/20/15 10:56 PM  Result Value Ref Range Status   Specimen Description BLOOD BLOOD LEFT FOREARM  Final    Special Requests BOTTLES DRAWN AEROBIC AND ANAEROBIC 5 ML  Final   Culture   Final    NO GROWTH 5 DAYS Performed at [facility]    Report Status 12/26/2015 FINAL  Final  Urine culture     Status: None   Collection Time: 12/21/15  2:01 AM  Result Value Ref Range Status   Specimen Description URINE, RANDOM  Final   Special Requests NONE  Final   Culture   Final    NO GROWTH 1 DAY Performed at [facility]    Report Status 12/22/2015 FINAL  Final  Culture, sputum-assessment     Status: None   Collection Time: 12/23/15  6:37 AM  Result Value Ref Range Status   Specimen Description SPUTUM  Final   Special Requests NONE  Final   Sputum evaluation   Final    MICROSCOPIC FINDINGS  SUGGEST THAT THIS SPECIMEN IS NOT REPRESENTATIVE OF LOWER RESPIRATORY SECRETIONS. PLEASE RECOLLECT. NOTIFIED RN AT [phone] ON 03.24.17 BY SHUEA    Report Status 12/23/2015 FINAL  Final  Body fluid culture     Status: None   Collection Time: 12/25/15 10:14 AM  Result Value Ref Range Status   Specimen Description PLEURAL DRAIN PLACEMENT  Final   Special Requests NONE  Final   Gram Stain   Final    CYTOSPIN SLIDE WBC PRESENT, PREDOMINANTLY PMN NO ORGANISMS SEEN    Culture   Final    NO GROWTH 3 DAYS [facility]    Report Status 12/29/2015 FINAL  Final  Acid Fast Smear (AFB)     Status: None   Collection Time: 12/28/15  2:38 PM  Result Value Ref Range Status   AFB Specimen Processing Comment  Final    Comment: Tissue Grinding and Digestion/Decontamination   Acid Fast Smear Negative  Final    Comment: (NOTE) Performed At: [facility] [address] [place], [place] [id number] Mila HomerHancock William F MD JX:[phone]    Source (AFB) TISSUE  Final    Comment: RIGHT LUNG   Culture, body fluid-bottle     Status: None (Preliminary result)   Collection Time: 12/28/15  2:38 PM  Result Value Ref Range Status   Specimen Description FLUID RIGHT PLEURAL  Final   Special  Requests   Final    PATIENT ON FOLLOWING CEFTRIAXONE  ZITHROMAX AND VANC   Culture NO GROWTH 2 DAYS  Final   Report Status PENDING  Incomplete  Acid Fast Smear (AFB)     Status: None   Collection Time: 12/28/15  2:38 PM  Result Value Ref Range Status   AFB Specimen Processing Concentration  Final   Acid Fast Smear Negative  Final    Comment: (NOTE) Performed At: [facility] [address] [place], [place] [id number] Mila HomerHancock William F MD ON:[phone]    Source (AFB) FLUID  Final    Comment: RIGHT PLEURAL   Gram stain     Status: None   Collection Time: 12/28/15  2:38 PM  Result Value Ref Range Status   Specimen Description FLUID RIGHT PLEURAL  Final   Special Requests   Final    PATIENT ON FOLLOWING CEFTRIAXONE ZITHROMAX AND VANC   Gram Stain   Final    FEW WBC PRESENT,BOTH PMN AND MONONUCLEAR NO ORGANISMS SEEN    Report Status 12/28/2015 FINAL  Final  Tissue culture     Status: None (Preliminary result)   Collection Time: 12/28/15  2:41 PM  Result Value Ref Range Status   Specimen Description TISSUE RIGHT LUNG  Final   Special Requests   Final    RIGHT PARIETAL PEEL PATIENT ON FOLLOWING VANC ZITHROMAX AND CEFTRIAXONE   Gram Stain PENDING  Incomplete   Culture   Final    Culture reincubated for better growth [facility]formed at [facility]    Report Status PENDING  Incomplete  Tissue culture     Status: None (Preliminary result)   Collection Time: 12/28/15  2:41 PM  Result Value Ref Range Status   Specimen Description TISSUE RIGHT LUNG  Final   Special Requests   Final    RIGHT VICERAL PEEL PATIENT ON FOLLOWING VANC ZITHROMAX AND CEFTRIAXONE   Gram Stain   Final    FEW WBC PRESENT, PREDOMINANTLY MONONUCLEAR NO ORGANISMS SEEN [facility]formed at [facility]    Culture   Final  NO GROWTH 1 DAY Performed at [facility]    Report Status PENDING  Incomplete  Acid Fast Smear (AFB)     Status: None   Collection Time: 12/28/15  2:41 PM  Result  Value Ref Range Status   AFB Specimen Processing Comment  Final    Comment: Tissue Grinding and Digestion/Decontamination   Acid Fast Smear Negative  Final    Comment: (NOTE) Performed At: [facility] [address] [place], [place] [id number] Mila Homer MD WU:[id number]    Source (AFB) TISSUE  Final    Comment: RIGHT LUNG   MRSA PCR Screening     Status: None   Collection Time: 12/28/15 11:00 PM  Result Value Ref Range Status   MRSA by PCR NEGATIVE NEGATIVE Final    Comment:        The GeneXpert MRSA Assay (FDA approved for NASAL specimens only), is one component of a comprehensive MRSA colonization surveillance program. It is not intended to diagnose MRSA infection nor to guide or monitor treatment for MRSA infections.      Scheduled Meds: . acetaminophen  1,000 mg Oral 4 times per day   Or  . acetaminophen (TYLENOL) oral liquid 160 mg/5 mL  1,000 mg Oral 4 times per day  . albuterol  2.5 mg Nebulization QID  . amLODipine  5 mg Oral Daily  . aspirin EC  81 mg Oral Daily  . azithromycin  500 mg Oral Daily  . benztropine  0.5 mg Oral BID  . bisacodyl  10 mg Oral Daily  . carbamazepine  400 mg Oral QHS  . cefTRIAXone (ROCEPHIN)  IV  2 g Intravenous Q24H  . enoxaparin (LOVENOX) injection  40 mg Subcutaneous Q24H  . fentaNYL   Intravenous 6 times per day  . haloperidol  20 mg Oral QHS  . loratadine  10 mg Oral Daily  . pantoprazole  40 mg Oral QAC breakfast  . senna-docusate  1 tablet Oral QHS  . simvastatin  10 mg Oral QHS   Continuous Infusions: . dextrose 5 % and 0.9% NaCl 50 mL/hr at 12/29/15 2000

## 2015-12-31 ENCOUNTER — Inpatient Hospital Stay (HOSPITAL_COMMUNITY): Payer: Medicare Other

## 2015-12-31 LAB — CBC
HEMATOCRIT: 28.7 % — AB (ref 39.0–52.0)
Hemoglobin: 9.7 g/dL — ABNORMAL LOW (ref 13.0–17.0)
MCH: 29.9 pg (ref 26.0–34.0)
MCHC: 33.8 g/dL (ref 30.0–36.0)
MCV: 88.6 fL (ref 78.0–100.0)
PLATELETS: 392 10*3/uL (ref 150–400)
RBC: 3.24 MIL/uL — ABNORMAL LOW (ref 4.22–5.81)
RDW: 13.2 % (ref 11.5–15.5)
WBC: 16.6 10*3/uL — AB (ref 4.0–10.5)

## 2015-12-31 LAB — BASIC METABOLIC PANEL
ANION GAP: 8 (ref 5–15)
BUN: 19 mg/dL (ref 6–20)
CALCIUM: 8.8 mg/dL — AB (ref 8.9–10.3)
CO2: 26 mmol/L (ref 22–32)
CREATININE: 1.73 mg/dL — AB (ref 0.61–1.24)
Chloride: 108 mmol/L (ref 101–111)
GFR calc non Af Amer: 40 mL/min — ABNORMAL LOW (ref >60)
GFR, EST AFRICAN AMERICAN: 46 mL/min — AB (ref >60)
Glucose, Bld: 158 mg/dL — ABNORMAL HIGH (ref 65–99)
Potassium: 4 mmol/L (ref 3.5–5.1)
Sodium: 142 mmol/L (ref 135–145)

## 2015-12-31 MED ORDER — ALBUTEROL SULFATE (2.5 MG/3ML) 0.083% IN NEBU: 2.5000 mg | INHALATION_SOLUTION | RESPIRATORY_TRACT | Status: DC | PRN

## 2015-12-31 NOTE — Progress Notes (Signed)
Removed anterior chest tube per MD order, posterior chest tube to water seal. Pt tolerated removal well. Marisue Ivanobyn Shianne Zeiser RN

## 2015-12-31 NOTE — Progress Notes (Addendum)
      [address]       [place] [id number]             [phone]      3 Days Post-Op Procedure(s) (LRB): RIGHT VIDEO ASSISTED THORACOSCOPY (VATS)/DECORTICATION (Right) DRAINAGE OF RIGHT  PLEURAL EFFUSION (Right)   Subjective:  [name] is sleepy this morning.  Awoke and denied pain.     Objective: Vital signs in last 24 hours: Temp:  [97.4 F (36.3 C)-98.4 F (36.9 C)] 97.4 F (36.3 C) (04/01 0336) Pulse Rate:  [85-120] 85 (04/01 0805) Cardiac Rhythm:  [-] Normal sinus rhythm (04/01 0900) Resp:  [13-36] 21 (04/01 0805) BP: (96-131)/(69-91) 123/75 mmHg (04/01 0805) SpO2:  [93 %-100 %] 94 % (04/01 0805) Weight:  [181 lb 14.4 oz (82.509 kg)] 181 lb 14.4 oz (82.509 kg) (04/01 0336)  Intake/Output from previous day: 03/31 0701 - 04/01 0700 In: 1070 [P.O.:620; I.V.:450] Out: 1980 [Urine:1970; Chest Tube:10] Intake/Output this shift: Total I/O In: 240 [P.O.:240] Out: -   General appearance: alert, cooperative and no distress Heart: regular rate and rhythm Lungs: clear to auscultation bilaterally Abdomen: soft, non-tender; bowel sounds normal; no masses,  no organomegaly Wound: clean and dry  Lab Results:  Recent Labs  12/30/15 0300 12/30/15 1929 12/31/15 0313  WBC 22.6*  --  16.6*  HGB 10.4* 12.9* 9.7*  HCT 32.0* 38.0* 28.7*  PLT 400  --  392   BMET:  Recent Labs  12/30/15 0300 12/30/15 1929 12/31/15 0313  NA 140 142 142  K 4.1 3.9 4.0  CL 107  --  108  CO2 26  --  26  GLUCOSE 144* 145* 158*  BUN 17  --  19  CREATININE 1.79*  --  1.73*  CALCIUM 8.6*  --  8.8*    PT/INR: No results for input(s): LABPROT, INR in the last 72 hours. ABG    Component Value Date/Time   PHART 7.304* 12/30/2015 1938   HCO3 26.6* 12/30/2015 1938   TCO2 28 12/30/2015 1938   ACIDBASEDEF 1.0 12/30/2015 1938   O2SAT 94.0 12/30/2015 1938   CBG (last 3)  No results for input(s): GLUCAP in the last 72 hours.  Assessment/Plan: S/P Procedure(s)  (LRB): RIGHT VIDEO ASSISTED THORACOSCOPY (VATS)/DECORTICATION (Right) DRAINAGE OF RIGHT  PLEURAL EFFUSION (Right)  1. Chest tube- no air leak present, 10 cc output yesterday- likely remove final tube today 2. Pulm- not on oxygen, CXR with atelectasis bilaterally continue flutter valve 3. Renal- creatinine down to 1.73, continue to avoid nephrotoxic agents 4. ID- tissue culture shows few GNR, fluid culture remains negative- on Rocephin Zithromax 5. Dispo- patient stable, likely remove final chest tube today, care per primary   LOS: 10 days    BARRETT, ERIN 12/31/2015  Patient seen and examined, agree with above CXR looks good Dc anterior CT  Viviann SpareSteven C. Dorris FetchHendrickson, MD [facility] [phone]

## 2015-12-31 NOTE — Progress Notes (Signed)
Patient ID: Aqil Goetting, male   DOB: September 01, 1951, 65 y.o.   MRN: [id number]  [facility] PROGRESS NOTE  Willow Shanahan WUJ:[id number] DOB: 1951/01/01 DOA: 12/20/2015  PCP: case manager consult to establish PCP  Brief narrative:    65 year old male, supposedly resident of a group home, history of schizophrenia, HTN, HLD, presented to ED with right-sided pleuritic type of chest pain, cough, some dyspnea. In the ED, febrile 102F and chest x-ray with right-sided infiltrate with effusion. CT chest confirmed RLL PNA & moderate R sided pleural effusion. Admitted for community-acquired pneumonia and right-sided pleural effusion. Pulmonology was consulted due to worsening right-sided pleural effusion. IR was consulted and patient is S/P CT-guided right thoracentesis by IR on 3/26. As per pulmonary follow-up, patient has complicated right pleural effusion which is loculated and cannot be drained by catheter alone and hence TCTS was consulted for VATS procedure at [facility] 12/28/15.  Assessment/Plan:    RLL Community-acquired pneumonia with complicated pleural effusion / empyema  - Influenza PCR, Urine Legionella/Strep all negative, blood Cx x 2 neg - CT chest 3/22: RLL consolidation compatible with pneumonia, superimposed moderate-sized right pleural effusion - S/P CT-guided right thoracentesis and placement of drainage catheter by IR on 3/26 - S/P R VATS decortication for empyema, post op day #3, tissue culture with g- rods, final report pending  - transitioned from vanc and zosyn to zithro and rocephin, rocephin should be adequate in coverage  - close monitoring of WBC, overall trending down   Leukocytosis, ? SIRS, post op  - pt mee criteria for SIRS - post op related - already on ABX as noted above  - currently afebrile, monitor CBC  Essential hypertension - Controlled on amlodipine - reasonably stable so far   Hyperlipidemia - Continue statin  Schizophrenia - stable  at this time   Acute on Stage III chronic kidney disease - Baseline creatinine unknown-may be 1.3-1.4 range - Cr now up, monitor urine output closely, avoid nephrotoxic meds  - BMP in AM  Anemia of chronic disease  - Stable, no signs of bleeding   DVT prophylaxis - Lovenox SQ  Code Status: Full.  Family Communication:  plan of care discussed with the patient Disposition Plan: Unable to determine, follow clinical progress, possibly by 4/3  IV access:  Peripheral IV  Procedures and diagnostic studies:    Dg Chest 1 View 12/25/2015 Satisfactory post chest tube placement.   Ct Chest Wo Contrast 12/23/2015  Moderately large, partially loculated right pleural effusion which has greatly enlarged from 2 days ago and involves the major and minor fissures. 2. Associated compressive atelectasis as well as right middle and right lower lobe consolidation compatible with pneumonia.   US Renal 12/22/2015  Normal size kidneys without evidence of hydronephrosis. Mild increased cortical echogenicity which can be seen with medical renal disease. Three small left renal cysts.   Ct Image Guided Fluid Drain By Catheter 12/25/2015  Successful CT guided placement of a 12 French all purpose drain catheter into the right pleural space with aspiration of approximately 220 cc of serous, non foul smelling mL of pleural fluid. Samples were sent to the laboratory as requested by the ordering clinical team.   Medical Consultants:  PCCM IR CTS  Other Consultants:  None  IAnti-Infectives:   Azithromycin 3/21 > 3/25, 3/31 --> Rocephin 3/21 > 3/27, 3/31 -->  Debbora Presto, MD  [facility] Pager [phone]  If 7PM-7AM, please contact night-coverage [email or web address] Password [email or web address] 12/31/2015, 11:59 AM   LOS: 10  days   HPI/Subjective: No events overnight. Sleeping this AM, says he feels better.   Objective: Filed Vitals:   12/30/15 2137 12/30/15 2322 12/31/15 0336 12/31/15 0805  BP:  114/71 109/75 123/75   Pulse:  98 90 85  Temp:  98.4 F (36.9 C) 97.4 F (36.3 C)   TempSrc:  Oral Oral   Resp:  Height:      Weight:   82.509 kg (181 lb 14.4 oz)   SpO2: 96% 94% 96% 94%    Intake/Output Summary (Last 24 hours) at 12/31/15 1159 Last data filed at 12/31/15 1000  Gross per 24 hour  Intake   1110 ml  Output   1650 ml  Net   -540 ml    Exam:   General:  Pt is somnolent but easy to awake, not in acute distress  Cardiovascular: Regular rate and rhythm, no rubs, no gallops  Respiratory: diminished breath sounds at the right base, no wheezing   Abdomen: Soft, non tender, non distended, bowel sounds present, no guarding  Extremities: No edema, pulses DP and PT palpable bilaterally  Data Reviewed: Basic Metabolic Panel:  Recent Labs Lab 12/27/15 2140 12/29/15 0420 12/30/15 0300 12/30/15 1929 12/31/15 0313  NA 142 140 140 142 142  K 4.0 4.3 4.1 3.9 4.0  CL 106 109 107  --  108  CO2 --  26  GLUCOSE 131* 154* 144* 145* 158*  BUN --  19  CREATININE 1.12 1.15 1.79*  --  1.73*  CALCIUM 8.7* 8.1* 8.6*  --  8.8*   Liver Function Tests:  Recent Labs Lab 12/27/15 2140 12/30/15 0300  AST 20 25  ALT 23 25  ALKPHOS 115 91  BILITOT 0.4 0.6  PROT 6.2* 6.1*  ALBUMIN 2.0* 2.0*   CBC:  Recent Labs Lab 12/26/15 0359 12/27/15 2140 12/29/15 0420 12/30/15 0300 12/30/15 1929 12/31/15 0313  WBC 13.9* 11.6* 19.9* 22.6*  --  16.6*  HGB 11.5* 11.8* 9.8* 10.4* 12.9* 9.7*  HCT 33.5* 34.8* 28.8* 32.0* 38.0* 28.7*  MCV 85.2 87.2 88.3 88.4  --  88.6  PLT 321 382 320 400  --  392   Recent Results (from the past 240 hour(s))  Culture, sputum-assessment     Status: None   Collection Time: 12/23/15  6:37 AM  Result Value Ref Range Status   Specimen Description SPUTUM  Final   Special Requests NONE  Final   Sputum evaluation   Final    MICROSCOPIC FINDINGS SUGGEST THAT THIS SPECIMEN IS NOT REPRESENTATIVE OF LOWER RESPIRATORY SECRETIONS. PLEASE  RECOLLECT. NOTIFIED RN AT [phone] ON 03.24.17 BY SHUEA    Report Status 12/23/2015 FINAL  Final  Body fluid culture     Status: None   Collection Time: 12/25/15 10:14 AM  Result Value Ref Range Status   Specimen Description PLEURAL DRAIN PLACEMENT  Final   Special Requests NONE  Final   Gram Stain   Final    CYTOSPIN SLIDE WBC PRESENT, PREDOMINANTLY PMN NO ORGANISMS SEEN    Culture   Final    NO GROWTH 3 DAYS Performed at [facility]    Report Status 12/29/2015 FINAL  Final  Acid Fast Smear (AFB)     Status: None   Collection Time: 12/28/15  2:38 PM  Result Value Ref Range Status   AFB Specimen Processing Comment  Final    Comment: Tissue Grinding and Digestion/Decontamination   Acid Fast  Smear Negative  Final    Comment: (NOTE) Performed At: [facility] [address] [place], [place] [id number] Mila HomerHancock William F MD JX:[id number]:[phone]    Source (AFB) TISSUE  Final    Comment: RIGHT LUNG   Culture, body fluid-bottle     Status: None (Preliminary result)   Collection Time: 12/28/15  2:38 PM  Result Value Ref Range Status   Specimen Description FLUID RIGHT PLEURAL  Final   Special Requests   Final    PATIENT ON FOLLOWING CEFTRIAXONE  ZITHROMAX AND VANC   Culture NO GROWTH 2 DAYS  Final   Report Status PENDING  Incomplete  Acid Fast Smear (AFB)     Status: None   Collection Time: 12/28/15  2:38 PM  Result Value Ref Range Status   AFB Specimen Processing Concentration  Final   Acid Fast Smear Negative  Final    Comment: (NOTE) Performed At: [facility] [address] [place], [place] [id number] Mila HomerHancock William F MD ON:[id number]:[phone]    Source (AFB) FLUID  Final    Comment: RIGHT PLEURAL   Gram stain     Status: None   Collection Time: 12/28/15  2:38 PM  Result Value Ref Range Status   Specimen Description FLUID RIGHT PLEURAL  Final   Special Requests   Final    PATIENT ON FOLLOWING CEFTRIAXONE ZITHROMAX AND VANC   Gram Stain   Final    FEW WBC  PRESENT,BOTH PMN AND MONONUCLEAR NO ORGANISMS SEEN    Report Status 12/28/2015 FINAL  Final  Tissue culture     Status: None (Preliminary result)   Collection Time: 12/28/15  2:41 PM  Result Value Ref Range Status   Specimen Description TISSUE RIGHT LUNG  Final   Special Requests   Final    RIGHT PARIETAL PEEL PATIENT ON FOLLOWING VANC ZITHROMAX AND CEFTRIAXONE   Gram Stain PENDING  Incomplete   Culture   Final    RARE GRAM NEGATIVE RODS [facility]formed [facility]    Report Status PENDING  Incomplete  Tissue culture     Status: None (Preliminary result)   Collection Time: 12/28/15  2:41 PM  Result Value Ref Range Status   Specimen Description TISSUE RIGHT LUNG  Final   Special Requests   Final    RIGHT VICERAL PEEL PATIENT ON FOLLOWING VANC ZITHROMAX AND CEFTRIAXONE   Gram Stain   Final    FEW WBC PRESENT, PREDOMINANTLY MONONUCLEAR NO ORGANISMS SEEN Performed at [facility]    Culture   Final    RARE GRAM NEGATIVE RODS Performed at [facility]    Report Status PENDING  Incomplete  Acid Fast Smear (AFB)     Status: None   Collection Time: 12/28/15  2:41 PM  Result Value Ref Range Status   AFB Specimen Processing Comment  Final    Comment: Tissue Grinding and Digestion/Decontamination   Acid Fast Smear Negative  Final    Comment: (NOTE) Performed At: [facility] [address] [place], [place] [id number] Mila HomerHancock William F MD DG:[id number]:[phone]    Source (AFB) TISSUE  Final    Comment: RIGHT LUNG   MRSA PCR Screening     Status: None   Collection Time: 12/28/15 11:00 PM  Result Value Ref Range Status   MRSA by PCR NEGATIVE NEGATIVE Final     Scheduled Meds: . acetaminophen  1,000 mg Oral 4 times per day   Or  . acetaminophen (TYLENOL) oral liquid 160 mg/5 mL  1,000 mg Oral 4 times per day  . amLODipine  5 mg Oral Daily  . aspirin EC  81 mg Oral Daily  . azithromycin  500 mg Oral Daily  . benztropine  0.5 mg Oral BID  . bisacodyl   10 mg Oral Daily  . carbamazepine  400 mg Oral QHS  . cefTRIAXone (ROCEPHIN)  IV  2 g Intravenous Q24H  . enoxaparin (LOVENOX) injection  40 mg Subcutaneous Q24H  . haloperidol  20 mg Oral QHS  . loratadine  10 mg Oral Daily  . pantoprazole  40 mg Oral QAC breakfast  . senna-docusate  1 tablet Oral QHS  . simvastatin  10 mg Oral QHS   Continuous Infusions:

## 2016-01-01 ENCOUNTER — Inpatient Hospital Stay (HOSPITAL_COMMUNITY): Payer: Medicare Other

## 2016-01-01 LAB — CBC
HEMATOCRIT: 30.1 % — AB (ref 39.0–52.0)
HEMOGLOBIN: 9.6 g/dL — AB (ref 13.0–17.0)
MCH: 28.3 pg (ref 26.0–34.0)
MCHC: 31.9 g/dL (ref 30.0–36.0)
MCV: 88.8 fL (ref 78.0–100.0)
Platelets: 483 10*3/uL — ABNORMAL HIGH (ref 150–400)
RBC: 3.39 MIL/uL — AB (ref 4.22–5.81)
RDW: 13.2 % (ref 11.5–15.5)
WBC: 12.6 10*3/uL — ABNORMAL HIGH (ref 4.0–10.5)

## 2016-01-01 LAB — BASIC METABOLIC PANEL
ANION GAP: 8 (ref 5–15)
BUN: 14 mg/dL (ref 6–20)
CALCIUM: 9.2 mg/dL (ref 8.9–10.3)
CO2: 27 mmol/L (ref 22–32)
Chloride: 107 mmol/L (ref 101–111)
Creatinine, Ser: 1.61 mg/dL — ABNORMAL HIGH (ref 0.61–1.24)
GFR, EST AFRICAN AMERICAN: 51 mL/min — AB (ref >60)
GFR, EST NON AFRICAN AMERICAN: 44 mL/min — AB (ref >60)
Glucose, Bld: 103 mg/dL — ABNORMAL HIGH (ref 65–99)
POTASSIUM: 4.2 mmol/L (ref 3.5–5.1)
Sodium: 142 mmol/L (ref 135–145)

## 2016-01-01 LAB — TISSUE CULTURE

## 2016-01-01 MED ORDER — CIPROFLOXACIN HCL 500 MG PO TABS
500.0000 mg | ORAL_TABLET | Freq: Two times a day (BID) | ORAL | Status: DC
  Administered 2016-01-01 – 2016-01-03 (×5): 500 mg via ORAL
  Filled 2016-01-01 (×5): qty 1

## 2016-01-01 NOTE — Progress Notes (Addendum)
      [address]       [place] [id number]             [phone]      4 Days Post-Op Procedure(s) (LRB): RIGHT VIDEO ASSISTED THORACOSCOPY (VATS)/DECORTICATION (Right) DRAINAGE OF RIGHT  PLEURAL EFFUSION (Right)   Subjective:  Up in chair.  No complaints, states he had a good breakfast, slept well  Objective: Vital signs in last 24 hours: Temp:  [98.1 F (36.7 C)-98.5 F (36.9 C)] 98.2 F (36.8 C) (04/02 0709) Pulse Rate:  [87-105] 93 (04/02 0709) Cardiac Rhythm:  [-] Normal sinus rhythm (04/02 0709) Resp:  [11-27] 27 (04/02 0709) BP: (113-131)/(68-83) 122/83 mmHg (04/02 0709) SpO2:  [94 %-99 %] 97 % (04/02 0709) Weight:  [164 lb 14.5 oz (74.8 kg)] 164 lb 14.5 oz (74.8 kg) (04/02 0329)  Intake/Output from previous day: 04/01 0701 - 04/02 0700 In: 1060 [P.O.:960; IV Piggyback:100] Out: 2525 [Urine:2475; Chest Tube:50]  General appearance: alert, cooperative and no distress Heart: regular rate and rhythm Lungs: clear to auscultation bilaterally Wound: clean and dry  Lab Results:  Recent Labs  12/31/15 0313 01/01/16 0418  WBC 16.6* 12.6*  HGB 9.7* 9.6*  HCT 28.7* 30.1*  PLT 392 483*   BMET:  Recent Labs  12/31/15 0313 01/01/16 0418  NA 142 142  K 4.0 4.2  CL 108 107  CO2 26 27  GLUCOSE 158* 103*  BUN 19 14  CREATININE 1.73* 1.61*  CALCIUM 8.8* 9.2    PT/INR: No results for input(s): LABPROT, INR in the last 72 hours. ABG    Component Value Date/Time   PHART 7.304* 12/30/2015 1938   HCO3 26.6* 12/30/2015 1938   TCO2 28 12/30/2015 1938   ACIDBASEDEF 1.0 12/30/2015 1938   O2SAT 94.0 12/30/2015 1938   CBG (last 3)  No results for input(s): GLUCAP in the last 72 hours.  Assessment/Plan: S/P Procedure(s) (LRB): RIGHT VIDEO ASSISTED THORACOSCOPY (VATS)/DECORTICATION (Right) DRAINAGE OF RIGHT  PLEURAL EFFUSION (Right)  1. Chest tube- no air leak present, on water seal, 50cc output yesterday- likely remove final chest tube  today 2. Pulm- no acute issues, continue flutter valve 3. Renal- creatinine continues to trend down, currently at 1.61 4. ID- Tissue culture showing Serratia Marcescens- will transition to oral Cipro per sensitivity results 5. Dispo- patient stable, likely d/c final chest tubes today, continue care per primary   LOS: 11 days    BARRETT, ERIN 01/01/2016  Patient seen and examined, agree with above Dc chest tube Dc foley Change to PO antibiotics with cipro for 2 weeks total postop  Viviann SpareSteven C. Dorris FetchHendrickson, MD [facility] [phone]

## 2016-01-01 NOTE — Progress Notes (Addendum)
Pharmacy Antibiotic Note  Ian Carter is a 65 y.o. male admitted on 12/20/2015 with CAP and empyema.  Pharmacy has been consulted for ciprofloxacin dosing.  Abx D#5 for PNA, empyema. POD#4 VATS. R lung tissue growing S marcescens sensitive to ciprofloxacin. WBC trending down to 12.6, Cr 1.61 (CrCl ~49 ml/min). Tmax 98.64F.   Of note, ciprofloxacin may possibly increase carbamazepine levels. Will need close monitoring of plt while on ciprofloxacin.   Plan: Start ciprofloxacin 500 mg PO BID Monitor CBC, paying close attention to plt trend  Height: 6' (182.9 cm) Weight: 164 lb 14.5 oz (74.8 kg) IBW/kg (Calculated) : 77.6  Temp (24hrs), Avg:98.3 F (36.8 C), Min:98.1 F (36.7 C), Max:98.5 F (36.9 C)   Recent Labs Lab 12/27/15 2140 12/29/15 0420 12/30/15 0300 12/31/15 0313 01/01/16 0418  WBC 11.6* 19.9* 22.6* 16.6* 12.6*  CREATININE 1.12 1.15 1.79* 1.73* 1.61*    Estimated Creatinine Clearance: 49 mL/min (by C-G formula based on Cr of 1.61).    No Known Allergies  Antimicrobials this admission: 3/29 vanc>>3/29 3/29 zosyn>>3/29 3/29 ctx>> 4/2 3/29 azithro>> 4/2 4/2 cipro >> (for 2 weeks total postop)  Dose adjustments this admission: None   Microbiology results: 3/21 flu panel: neg 3/21: BCx2: ngf 3/22 UC: ngf 3/26 pleural fluid drain: ngf 3/29 R lung tissue:  Serratia marcescens (R cefazolin, intermediate tobra) 3/29 MRSA pcr: neg 3/29 R pleural fluid: ngtd   Thank you for allowing pharmacy to be a part of this patient's care.  Ian Carter 01/01/2016 10:38 AM

## 2016-01-01 NOTE — Progress Notes (Signed)
PT Cancellation Note  Patient Details Name: Ian Carter MRN: [id number] DOB: [birth year]   Cancelled Treatment:    Reason Eval/Treat Not Completed: Fatigue/lethargy limiting ability to participate.  Attempted to see patient x 2 today.  Patient declined due to fatigue - up with nursing earlier.  Will return tomorrow for PT evaluation.   [name], Ian Carter H 01/01/2016, 5:35 PM Ian Carter, PT, [facility] Pager [phone]

## 2016-01-01 NOTE — Progress Notes (Signed)
Patient ID: Ian Carter, male   DOB: Mar 27, 1951, 65 y.o.   MRN: [id number]  [facility] PROGRESS NOTE  Ian Carter WUJ:[id number] DOB: [birth year] DOA: 12/20/2015  PCP: case manager consult to establish PCP  Brief narrative:    65 year old male, supposedly resident of a group home, history of schizophrenia, HTN, HLD, presented to ED with right-sided pleuritic type of chest pain, cough, some dyspnea. In the ED, febrile 102F and chest x-ray with right-sided infiltrate with effusion. CT chest confirmed RLL PNA & moderate R sided pleural effusion. Admitted for community-acquired pneumonia and right-sided pleural effusion. Pulmonology was consulted due to worsening right-sided pleural effusion. IR was consulted and patient is S/P CT-guided right thoracentesis by IR on 3/26. As per pulmonary follow-up, patient has complicated right pleural effusion which is loculated and cannot be drained by catheter alone and hence TCTS was consulted for VATS procedure at [facility] 12/28/15.  Assessment/Plan:    RLL Community-acquired pneumonia with complicated pleural effusion / empyema  - Influenza PCR, Urine Legionella/Strep all negative, blood Cx x 2 neg - CT chest 3/22: RLL consolidation compatible with pneumonia, superimposed moderate-sized right pleural effusion - S/P CT-guided right thoracentesis and placement of drainage catheter by IR on 3/26 - S/P R VATS decortication for empyema, post op day #3, tissue culture with g- rods, final report pending  - transitioned from vanc and zosyn to zithro and rocephin, now on oral Cipro for two more weeks  - close monitoring of WBC, overall trending down  - CT tube pulled out   Leukocytosis, ? SIRS, post op  - pt mee criteria - post op related, resolving   Essential hypertension - Controlled on amlodipine - reasonably stable so far   Hyperlipidemia - Continue statin  Schizophrenia - stable at this time   Acute on Stage III chronic  kidney disease - Baseline creatinine unknown-may be 1.3-1.4 range - Cr trending down  - BMP in AM  Anemia of chronic disease  - Stable, no signs of bleeding   DVT prophylaxis - Lovenox SQ  Code Status: Full.  Family Communication:  plan of care discussed with the patient Disposition Plan: Unable to determine, follow clinical progress, possibly by 4/3  IV access:  Peripheral IV  Procedures and diagnostic studies:    Dg Chest 1 View 12/25/2015 Satisfactory post chest tube placement.   Ct Chest Wo Contrast 12/23/2015  Moderately large, partially loculated right pleural effusion which has greatly enlarged from 2 days ago and involves the major and minor fissures. 2. Associated compressive atelectasis as well as right middle and right lower lobe consolidation compatible with pneumonia.   US Renal 12/22/2015  Normal size kidneys without evidence of hydronephrosis. Mild increased cortical echogenicity which can be seen with medical renal disease. Three small left renal cysts.   Ct Image Guided Fluid Drain By Catheter 12/25/2015  Successful CT guided placement of a 12 French all purpose drain catheter into the right pleural space with aspiration of approximately 220 cc of serous, non foul smelling mL of pleural fluid. Samples were sent to the laboratory as requested by the ordering clinical team.   Medical Consultants:  PCCM IR CTS  Other Consultants:  None  IAnti-Infectives:   Azithromycin 3/21 > 3/25, 3/31 --> 4/2 Rocephin 3/21 > 3/27, 3/31 --> 4/2 Cipro 4/2 --> two more weeks   Debbora Presto, MD  [facility] Pager [phone]  If 7PM-7AM, please contact night-coverage [email or web address] Password [email or web address] 01/01/2016, 1:34 PM   LOS: 11 days  HPI/Subjective: No events overnight. Sleeping this AM, says he feels better.   Objective: Filed Vitals:   12/31/15 2324 01/01/16 0329 01/01/16 0709 01/01/16 1100  BP: 113/71 131/75 122/83 111/71  Pulse: 90 87 93 94  Temp: 98.4 F (36.9 C)  98.3 F (36.8 C) 98.2 F (36.8 C)   TempSrc: Oral Oral Oral   Resp: Height:      Weight:  74.8 kg (164 lb 14.5 oz)    SpO2: 99% 98% 97% 92%    Intake/Output Summary (Last 24 hours) at 01/01/16 1334 Last data filed at 01/01/16 1200  Gross per 24 hour  Intake   1250 ml  Output   1800 ml  Net   -550 ml    Exam:   General:  Pt is somnolent but easy to awake, not in acute distress  Cardiovascular: Regular rate and rhythm, no rubs, no gallops  Respiratory: diminished breath sounds at the right base, no wheezing   Abdomen: Soft, non tender, non distended, bowel sounds present, no guarding  Extremities: No edema, pulses DP and PT palpable bilaterally  Data Reviewed: Basic Metabolic Panel:  Recent Labs Lab 12/27/15 2140 12/29/15 0420 12/30/15 0300 12/30/15 1929 12/31/15 0313 01/01/16 0418  NA 142 140 140 142 142 142  K 4.0 4.3 4.1 3.9 4.0 4.2  CL 106 109 107  --  108 107  CO2 --  26 27  GLUCOSE 131* 154* 144* 145* 158* 103*  BUN --  19 14  CREATININE 1.12 1.15 1.79*  --  1.73* 1.61*  CALCIUM 8.7* 8.1* 8.6*  --  8.8* 9.2   Liver Function Tests:  Recent Labs Lab 12/27/15 2140 12/30/15 0300  AST 20 25  ALT 23 25  ALKPHOS 115 91  BILITOT 0.4 0.6  PROT 6.2* 6.1*  ALBUMIN 2.0* 2.0*   CBC:  Recent Labs Lab 12/27/15 2140 12/29/15 0420 12/30/15 0300 12/30/15 1929 12/31/15 0313 01/01/16 0418  WBC 11.6* 19.9* 22.6*  --  16.6* 12.6*  HGB 11.8* 9.8* 10.4* 12.9* 9.7* 9.6*  HCT 34.8* 28.8* 32.0* 38.0* 28.7* 30.1*  MCV 87.2 88.3 88.4  --  88.6 88.8  PLT 382 320 400  --  392 483*   Recent Results (from the past 240 hour(s))  Culture, sputum-assessment     Status: None   Collection Time: 12/23/15  6:37 AM  Result Value Ref Range Status   Specimen Description SPUTUM  Final   Special Requests NONE  Final   Sputum evaluation   Final    MICROSCOPIC FINDINGS SUGGEST THAT THIS SPECIMEN IS NOT REPRESENTATIVE OF LOWER RESPIRATORY  SECRETIONS. PLEASE RECOLLECT. NOTIFIED RN AT [phone] ON 03.24.17 BY SHUEA    Report Status 12/23/2015 FINAL  Final  Body fluid culture     Status: None   Collection Time: 12/25/15 10:14 AM  Result Value Ref Range Status   Specimen Description PLEURAL DRAIN PLACEMENT  Final   Special Requests NONE  Final   Gram Stain   Final    CYTOSPIN SLIDE WBC PRESENT, PREDOMINANTLY PMN NO ORGANISMS SEEN    Culture   Final    NO GROWTH 3 DAYS Performed at [facility]    Report Status 12/29/2015 FINAL  Final  Acid Fast Smear (AFB)     Status: None   Collection Time: 12/28/15  2:38 PM  Result Value Ref Range Status   AFB Specimen Processing Comment  Final  Comment: Tissue Grinding and Digestion/Decontamination   Acid Fast Smear Negative  Final    Comment: (NOTE) Performed At: [facility] [address] [place], [place] [id number] Mila Homer MD WU:[id number]    Source (AFB) TISSUE  Final    Comment: RIGHT LUNG   Culture, body fluid-bottle     Status: None (Preliminary result)   Collection Time: 12/28/15  2:38 PM  Result Value Ref Range Status   Specimen Description FLUID RIGHT PLEURAL  Final   Special Requests   Final    PATIENT ON FOLLOWING CEFTRIAXONE  ZITHROMAX AND VANC   Culture NO GROWTH 2 DAYS  Final   Report Status PENDING  Incomplete  Acid Fast Smear (AFB)     Status: None   Collection Time: 12/28/15  2:38 PM  Result Value Ref Range Status   AFB Specimen Processing Concentration  Final   Acid Fast Smear Negative  Final    Comment: (NOTE) Performed At: [facility] [address] [place], [place] [id number] Mila Homer MD VH:[id number]    Source (AFB) FLUID  Final    Comment: RIGHT PLEURAL   Gram stain     Status: None   Collection Time: 12/28/15  2:38 PM  Result Value Ref Range Status   Specimen Description FLUID RIGHT PLEURAL  Final   Special Requests   Final    PATIENT ON FOLLOWING CEFTRIAXONE ZITHROMAX AND VANC   Gram Stain    Final    FEW WBC PRESENT,BOTH PMN AND MONONUCLEAR NO ORGANISMS SEEN    Report Status 12/28/2015 FINAL  Final  Tissue culture     Status: None (Preliminary result)   Collection Time: 12/28/15  2:41 PM  Result Value Ref Range Status   Specimen Description TISSUE RIGHT LUNG  Final   Special Requests   Final    RIGHT PARIETAL PEEL PATIENT ON FOLLOWING VANC ZITHROMAX AND CEFTRIAXONE   Gram Stain PENDING  Incomplete   Culture   Final    RARE GRAM NEGATIVE RODS Performed at [facility]    Report Status PENDING  Incomplete  Tissue culture     Status: None (Preliminary result)   Collection Time: 12/28/15  2:41 PM  Result Value Ref Range Status   Specimen Description TISSUE RIGHT LUNG  Final   Special Requests   Final    RIGHT VICERAL PEEL PATIENT ON FOLLOWING VANC ZITHROMAX AND CEFTRIAXONE   Gram Stain   Final    FEW WBC PRESENT, PREDOMINANTLY MONONUCLEAR NO ORGANISMS SEEN Performed at [facility]    Culture   Final    RARE GRAM NEGATIVE RODS Performed at [facility]    Report Status PENDING  Incomplete  Acid Fast Smear (AFB)     Status: None   Collection Time: 12/28/15  2:41 PM  Result Value Ref Range Status   AFB Specimen Processing Comment  Final    Comment: Tissue Grinding and Digestion/Decontamination   Acid Fast Smear Negative  Final    Comment: (NOTE) Performed At: [facility] [address] [place], [place] [id number] Mila Homer MD UV:[id number]    Source (AFB) TISSUE  Final    Comment: RIGHT LUNG   MRSA PCR Screening     Status: None   Collection Time: 12/28/15 11:00 PM  Result Value Ref Range Status   MRSA by PCR NEGATIVE NEGATIVE Final     Scheduled Meds: . acetaminophen  1,000 mg Oral 4 times per day   Or  .  acetaminophen (TYLENOL) oral liquid 160 mg/5 mL  1,000 mg Oral 4 times per day  . amLODipine  5 mg Oral Daily  . aspirin EC  81 mg Oral Daily  . benztropine  0.5 mg Oral BID  . bisacodyl  10 mg Oral Daily   . carbamazepine  400 mg Oral QHS  . ciprofloxacin  500 mg Oral BID  . enoxaparin (LOVENOX) injection  40 mg Subcutaneous Q24H  . haloperidol  20 mg Oral QHS  . loratadine  10 mg Oral Daily  . pantoprazole  40 mg Oral QAC breakfast  . senna-docusate  1 tablet Oral QHS  . simvastatin  10 mg Oral QHS   Continuous Infusions:

## 2016-01-02 ENCOUNTER — Inpatient Hospital Stay (HOSPITAL_COMMUNITY): Payer: Medicare Other

## 2016-01-02 LAB — TISSUE CULTURE: GRAM STAIN: NONE SEEN

## 2016-01-02 LAB — CULTURE, BODY FLUID W GRAM STAIN -BOTTLE: Culture: NO GROWTH

## 2016-01-02 LAB — CBC
HEMATOCRIT: 30.9 % — AB (ref 39.0–52.0)
HEMOGLOBIN: 9.8 g/dL — AB (ref 13.0–17.0)
MCH: 28.2 pg (ref 26.0–34.0)
MCHC: 31.7 g/dL (ref 30.0–36.0)
MCV: 89 fL (ref 78.0–100.0)
Platelets: 498 10*3/uL — ABNORMAL HIGH (ref 150–400)
RBC: 3.47 MIL/uL — ABNORMAL LOW (ref 4.22–5.81)
RDW: 13.2 % (ref 11.5–15.5)
WBC: 10.9 10*3/uL — ABNORMAL HIGH (ref 4.0–10.5)

## 2016-01-02 LAB — BASIC METABOLIC PANEL
Anion gap: 8 (ref 5–15)
BUN: 17 mg/dL (ref 6–20)
CALCIUM: 9.2 mg/dL (ref 8.9–10.3)
CHLORIDE: 106 mmol/L (ref 101–111)
CO2: 28 mmol/L (ref 22–32)
CREATININE: 1.75 mg/dL — AB (ref 0.61–1.24)
GFR calc non Af Amer: 39 mL/min — ABNORMAL LOW (ref >60)
GFR, EST AFRICAN AMERICAN: 46 mL/min — AB (ref >60)
GLUCOSE: 105 mg/dL — AB (ref 65–99)
Potassium: 4.1 mmol/L (ref 3.5–5.1)
Sodium: 142 mmol/L (ref 135–145)

## 2016-01-02 LAB — CULTURE, BODY FLUID-BOTTLE

## 2016-01-02 NOTE — Progress Notes (Signed)
Patient ID: Ian Carter, male   DOB: May 18, 1951, 65 y.o.   MRN: [id number]  [facility] PROGRESS NOTE  Burnette Pheasant PQZ:[id number] DOB: 08/17/1951 DOA: 12/20/2015  PCP: case manager consult to establish PCP  Brief narrative:    65 year old male, supposedly resident of a group home, history of schizophrenia, HTN, HLD, presented to ED with right-sided pleuritic type of chest pain, cough, some dyspnea. In the ED, febrile 102F and chest x-ray with right-sided infiltrate with effusion. CT chest confirmed RLL PNA & moderate R sided pleural effusion. Admitted for community-acquired pneumonia and right-sided pleural effusion. Pulmonology was consulted due to worsening right-sided pleural effusion. IR was consulted and patient is S/P CT-guided right thoracentesis by IR on 3/26. As per pulmonary follow-up, patient has complicated right pleural effusion which is loculated and cannot be drained by catheter alone and hence TCTS was consulted for VATS procedure at [facility] 12/28/15.  Assessment/Plan:    RLL Community-acquired pneumonia with complicated pleural effusion / empyema  - Influenza PCR, Urine Legionella/Strep all negative, blood Cx x 2 neg - CT chest 3/22: RLL consolidation compatible with pneumonia, superimposed moderate-sized right pleural effusion - S/P CT-guided right thoracentesis and placement of drainage catheter by IR on 3/26 - S/P R VATS decortication for empyema, post op day #4, tissue culture with g- rods, final report pending  - transitioned from vanc and zosyn to zithro and rocephin, now on oral Cipro for two more weeks  - close monitoring of WBC, overall trending down  - CT tube pulled out, pt clear for d/c from surgery standpoint, plan on d/c in AM  Leukocytosis, ? SIRS, post op  - pt met criteria - post op related, resolved   Essential hypertension - Controlled on amlodipine but bit soft this AM  Hyperlipidemia - Continue statin  Schizophrenia -  stable at this time   Acute on Stage III chronic kidney disease - Baseline creatinine unknown-may be 1.3-1.4 range - Cr trending down overall - BMP in AM  Anemia of chronic disease  - Stable, no signs of bleeding   DVT prophylaxis - Lovenox SQ  Code Status: Full.  Family Communication:  plan of care discussed with the patient Disposition Plan: by 4/4  IV access:  Peripheral IV  Procedures and diagnostic studies:    Dg Chest 1 View 12/25/2015 Satisfactory post chest tube placement.   Ct Chest Wo Contrast 12/23/2015  Moderately large, partially loculated right pleural effusion which has greatly enlarged from 2 days ago and involves the major and minor fissures. 2. Associated compressive atelectasis as well as right middle and right lower lobe consolidation compatible with pneumonia.   US Renal 12/22/2015  Normal size kidneys without evidence of hydronephrosis. Mild increased cortical echogenicity which can be seen with medical renal disease. Three small left renal cysts.   Ct Image Guided Fluid Drain By Catheter 12/25/2015  Successful CT guided placement of a 12 French all purpose drain catheter into the right pleural space with aspiration of approximately 220 cc of serous, non foul smelling mL of pleural fluid. Samples were sent to the laboratory as requested by the ordering clinical team.   Medical Consultants:  PCCM IR CTS  Other Consultants:  None  IAnti-Infectives:   Azithromycin 3/21 > 3/25, 3/31 --> 4/2 Rocephin 3/21 > 3/27, 3/31 --> 4/2 Cipro 4/2 --> two more weeks   Faye Ramsay, MD  [facility] Pager [phone]  If 7PM-7AM, please contact night-coverage [email or web address] Password [facility] 01/02/2016, 5:33 PM   LOS: 12  days   HPI/Subjective: No events overnight. Sleeping this AM, says he feels better.   Objective: Filed Vitals:   01/02/16 0759 01/02/16 0800 01/02/16 1148 01/02/16 1459  BP:  114/80 110/70 96/61  Pulse:      Temp: 97.9 F (36.6 C)  98.3 F (36.8  C) 98.2 F (36.8 C)  TempSrc: Oral  Oral Oral  Resp:  17 17 18   Height:      Weight:      SpO2:  99% 99% 99%    Intake/Output Summary (Last 24 hours) at 01/02/16 1733 Last data filed at 01/02/16 1600  Gross per 24 hour  Intake   1440 ml  Output   1200 ml  Net    240 ml    Exam:   General:  Pt is somnolent but easy to awake, not in acute distress  Cardiovascular: Regular rate and rhythm, no rubs, no gallops  Respiratory: diminished breath sounds at the right base, no wheezing   Abdomen: Soft, non tender, non distended, bowel sounds present, no guarding  Extremities: No edema, pulses DP and PT palpable bilaterally  Data Reviewed: Basic Metabolic Panel:  Recent Labs Lab 12/29/15 0420 12/30/15 0300 12/30/15 1929 12/31/15 0313 01/01/16 0418 01/02/16 0502  NA 140 140 142 142 142 142  K 4.3 4.1 3.9 4.0 4.2 4.1  CL 109 107  --  108 107 106  CO2 23 26  --  26 27 28   GLUCOSE 154* 144* 145* 158* 103* 105*  BUN 14 17  --  19 14 17   CREATININE 1.15 1.79*  --  1.73* 1.61* 1.75*  CALCIUM 8.1* 8.6*  --  8.8* 9.2 9.2   Liver Function Tests:  Recent Labs Lab 12/27/15 2140 12/30/15 0300  AST 20 25  ALT 23 25  ALKPHOS 115 91  BILITOT 0.4 0.6  PROT 6.2* 6.1*  ALBUMIN 2.0* 2.0*   CBC:  Recent Labs Lab 12/29/15 0420 12/30/15 0300 12/30/15 1929 12/31/15 0313 01/01/16 0418 01/02/16 0502  WBC 19.9* 22.6*  --  16.6* 12.6* 10.9*  HGB 9.8* 10.4* 12.9* 9.7* 9.6* 9.8*  HCT 28.8* 32.0* 38.0* 28.7* 30.1* 30.9*  MCV 88.3 88.4  --  88.6 88.8 89.0  PLT 320 400  --  392 483* 498*   Recent Results (from the past 240 hour(s))  Culture, sputum-assessment     Status: None   Collection Time: 12/23/15  6:37 AM  Result Value Ref Range Status   Specimen Description SPUTUM  Final   Special Requests NONE  Final   Sputum evaluation   Final    MICROSCOPIC FINDINGS SUGGEST THAT THIS SPECIMEN IS NOT REPRESENTATIVE OF LOWER RESPIRATORY SECRETIONS. PLEASE RECOLLECT. NOTIFIED RN AT  [phone] ON 03.24.17 BY SHUEA    Report Status 12/23/2015 FINAL  Final  Body fluid culture     Status: None   Collection Time: 12/25/15 10:14 AM  Result Value Ref Range Status   Specimen Description PLEURAL DRAIN PLACEMENT  Final   Special Requests NONE  Final   Gram Stain   Final    CYTOSPIN SLIDE WBC PRESENT, PREDOMINANTLY PMN NO ORGANISMS SEEN    Culture   Final    NO GROWTH 3 DAYS Performed at [facility]    Report Status 12/29/2015 FINAL  Final  Acid Fast Smear (AFB)     Status: None   Collection Time: 12/28/15  2:38 PM  Result Value Ref Range Status   AFB Specimen Processing Comment  Final  Comment: Tissue Grinding and Digestion/Decontamination   Acid Fast Smear Negative  Final    Comment: (NOTE) Performed At: [facility] [place], [place] [id number] Lindon Romp MD WU:[id number]    Source (AFB) TISSUE  Final    Comment: RIGHT LUNG   Culture, body fluid-bottle     Status: None (Preliminary result)   Collection Time: 12/28/15  2:38 PM  Result Value Ref Range Status   Specimen Description FLUID RIGHT PLEURAL  Final   Special Requests   Final    PATIENT ON FOLLOWING CEFTRIAXONE  ZITHROMAX AND VANC   Culture NO GROWTH 2 DAYS  Final   Report Status PENDING  Incomplete  Acid Fast Smear (AFB)     Status: None   Collection Time: 12/28/15  2:38 PM  Result Value Ref Range Status   AFB Specimen Processing Concentration  Final   Acid Fast Smear Negative  Final    Comment: (NOTE) Performed At: [facility] [place], [place] [id number] Lindon Romp MD VH:[id number]    Source (AFB) FLUID  Final    Comment: RIGHT PLEURAL   Gram stain     Status: None   Collection Time: 12/28/15  2:38 PM  Result Value Ref Range Status   Specimen Description FLUID RIGHT PLEURAL  Final   Special Requests   Final    PATIENT ON FOLLOWING CEFTRIAXONE ZITHROMAX AND VANC   Gram Stain   Final    FEW WBC PRESENT,BOTH PMN AND  MONONUCLEAR NO ORGANISMS SEEN    Report Status 12/28/2015 FINAL  Final  Tissue culture     Status: None (Preliminary result)   Collection Time: 12/28/15  2:41 PM  Result Value Ref Range Status   Specimen Description TISSUE RIGHT LUNG  Final   Special Requests   Final    RIGHT PARIETAL PEEL PATIENT ON FOLLOWING VANC ZITHROMAX AND CEFTRIAXONE   Gram Stain PENDING  Incomplete   Culture   Final    RARE GRAM NEGATIVE RODS Performed at [facility]    Report Status PENDING  Incomplete  Tissue culture     Status: None (Preliminary result)   Collection Time: 12/28/15  2:41 PM  Result Value Ref Range Status   Specimen Description TISSUE RIGHT LUNG  Final   Special Requests   Final    RIGHT VICERAL PEEL PATIENT ON FOLLOWING VANC ZITHROMAX AND CEFTRIAXONE   Gram Stain   Final    FEW WBC PRESENT, PREDOMINANTLY MONONUCLEAR NO ORGANISMS SEEN Performed at [facility]    Culture   Final    RARE GRAM NEGATIVE RODS Performed at [facility]    Report Status PENDING  Incomplete  Acid Fast Smear (AFB)     Status: None   Collection Time: 12/28/15  2:41 PM  Result Value Ref Range Status   AFB Specimen Processing Comment  Final    Comment: Tissue Grinding and Digestion/Decontamination   Acid Fast Smear Negative  Final    Comment: (NOTE) Performed At:  [facility] [place], [place] [id number] Lindon Romp MD UV:[id number]    Source (AFB) TISSUE  Final    Comment: RIGHT LUNG   MRSA PCR Screening     Status: None   Collection Time: 12/28/15 11:00 PM  Result Value Ref Range Status   MRSA by PCR NEGATIVE NEGATIVE Final     Scheduled Meds: . acetaminophen  1,000 mg Oral 4 times per day   Or  .  acetaminophen (TYLENOL) oral liquid 160 mg/5 mL  1,000 mg Oral 4 times per day  . amLODipine  5 mg Oral Daily  . aspirin EC  81 mg Oral Daily  . benztropine  0.5 mg Oral BID  . bisacodyl  10 mg Oral Daily  . carbamazepine  400 mg Oral QHS  .  ciprofloxacin  500 mg Oral BID  . enoxaparin (LOVENOX) injection  40 mg Subcutaneous Q24H  . haloperidol  20 mg Oral QHS  . loratadine  10 mg Oral Daily  . pantoprazole  40 mg Oral QAC breakfast  . senna-docusate  1 tablet Oral QHS  . simvastatin  10 mg Oral QHS   Continuous Infusions:

## 2016-01-02 NOTE — Care Management Note (Signed)
Case Management Note  Patient Details  Name: Ian Carter MRN: [id number] Date of Birth: 12/16/1950  Subjective/Objective:    Patient is from [facility], per pt eval he will need hhpt and rolling walker, patient states [facility] is ok to use for rolling walker.  NCM called group home and spoke with rep there, she states [facility] but not sure which one, I will need to call back in the am and speak with [name].  Patient 's brother [name]  [phone] (cell) , [phone] (home) states he usually transports his brother but it depends on what he has going on, so patient may need transport if group home does not provide and if brother unable to transport. NCM will cont to follow for dc needs.                 Action/Plan:   Expected Discharge Date:  12/28/15               Expected Discharge Plan:  Group Home [facility])  In-House Referral:  Clinical Social Work  Discharge planning Services     Post Acute Care Choice:    Choice offered to:  Patient  DME Arranged:    DME Agency:     HH Arranged:    HH Agency:     Status of Service:  In process, will continue to follow  Medicare Important Message Given:  Yes Date Medicare IM Given:    Medicare IM give by:    Date Additional Medicare IM Given:    Additional Medicare Important Message give by:     If discussed at Long Length of Stay Meetings, dates discussed:    Additional Comments:  Leone Havenaylor, Alim Cattell Clinton, RN 01/02/2016, 4:47 PM

## 2016-01-02 NOTE — Progress Notes (Signed)
5 Days Post-Op Procedure(s) (LRB): RIGHT VIDEO ASSISTED THORACOSCOPY (VATS)/DECORTICATION (Right) DRAINAGE OF RIGHT  PLEURAL EFFUSION (Right) Subjective: In very good spirits this morning Some incisional pain  Objective: Vital signs in last 24 hours: Temp:  [97.9 F (36.6 C)-99.2 F (37.3 C)] 98.8 F (37.1 C) (04/03 0507) Pulse Rate:  [85-109] 90 (04/03 0507) Cardiac Rhythm:  [-] Normal sinus rhythm (04/03 0507) Resp:  [16-26] 16 (04/03 0507) BP: (108-118)/(60-75) 108/60 mmHg (04/03 0507) SpO2:  [92 %-100 %] 99 % (04/03 0507) Weight:  [165 lb 5.5 oz (75 kg)] 165 lb 5.5 oz (75 kg) (04/03 0427)  Hemodynamic parameters for last 24 hours:    Intake/Output from previous day: 04/02 0701 - 04/03 0700 In: 1610 [P.O.:1560; IV Piggyback:50] Out: 1850 [Urine:1850] Intake/Output this shift:    General appearance: alert, cooperative and no distress Neurologic: intact Heart: regular rate and rhythm Lungs: clear to auscultation bilaterally Wound: clean and dry  Lab Results:  Recent Labs  01/01/16 0418 01/02/16 0502  WBC 12.6* 10.9*  HGB 9.6* 9.8*  HCT 30.1* 30.9*  PLT 483* 498*   BMET:  Recent Labs  01/01/16 0418 01/02/16 0502  NA 142 142  K 4.2 4.1  CL 107 106  CO2 27 28  GLUCOSE 103* 105*  BUN 14 17  CREATININE 1.61* 1.75*  CALCIUM 9.2 9.2    PT/INR: No results for input(s): LABPROT, INR in the last 72 hours. ABG    Component Value Date/Time   PHART 7.304* 12/30/2015 1938   HCO3 26.6* 12/30/2015 1938   TCO2 28 12/30/2015 1938   ACIDBASEDEF 1.0 12/30/2015 1938   O2SAT 94.0 12/30/2015 1938   CBG (last 3)  No results for input(s): GLUCAP in the last 72 hours.  Assessment/Plan: S/P Procedure(s) (LRB): RIGHT VIDEO ASSISTED THORACOSCOPY (VATS)/DECORTICATION (Right) DRAINAGE OF RIGHT  PLEURAL EFFUSION (Right) -  Chest x ray this AM shows a good result post decortication Would continue antibiotics another 2 weeks OK for dc from a surgical standpoint  whenever medical issues resolved   LOS: 12 days    Loreli SlotSteven C Raynell Scott 01/02/2016

## 2016-01-02 NOTE — Progress Notes (Signed)
CSW went and spoke with patient to verify residency. [facility] and has been a resident there for four years. No concerns voiced at this time regarding returning back once medically stable. Patient provided CSW with permission to contact facility to follow up.  CSW attempted to contact facility representative Onalee HuaDavid but received no answer. CSW left voice message at 3:58pm. CSW will continue to follow up with facility.   Fernande BoydenJoyce Meg Niemeier, LCSWA Clinical Social Worker [facility] [place] Ph: [phone]

## 2016-01-02 NOTE — Evaluation (Signed)
Physical Therapy Evaluation Patient Details Name: Omarius Grantham MRN: [id number] DOB: [birth year] Today's Date: 01/02/2016   History of Present Illness  Ian Carter is a 65 y.o. male with history of hypertension, hyperlipidemia, schizophrenia presents to the ER because of persistent right-sided chest pain or last 4-5 days. On 3//29 pt underwent a R VATS procedure.  Clinical Impression  Patient is s/p above surgery resulting in functional limitations due to the deficits listed below (see PT Problem List). Pt tolerated mobility well but requires 24/7 supervision and use of RW for safety.  Patient will benefit from skilled PT to increase their independence and safety with mobility to allow discharge to the venue listed below.       Follow Up Recommendations Home health PT;Supervision/Assistance - 24 hour    Equipment Recommendations  Rolling walker with 5" wheels    Recommendations for Other Services       Precautions / Restrictions Precautions Precautions: Fall Restrictions Weight Bearing Restrictions: No      Mobility  Bed Mobility Overal bed mobility: Modified Independent             General bed mobility comments: v/c's to use sidelying to log roll technique to minimize R flank pain  Transfers Overall transfer level: Needs assistance   Transfers: Sit to/from Stand Sit to Stand: Supervision         General transfer comment: pt demo'd stability with sit to stand transfers as well as good hand placement  Ambulation/Gait Ambulation/Gait assistance: Min guard Ambulation Distance (Feet): 300 Feet Assistive device: Rolling walker (2 wheeled) Gait Pattern/deviations: Step-through pattern;Decreased stride length;Narrow base of support Gait velocity: slow Gait velocity interpretation: Below normal speed for age/gender General Gait Details: v/c's to increase bask of support and decrease reliance on UEs on walker, asked pt a couple of times to amb without AD and pt  reports "no I would rather now, i feel better with the walker."  Stairs            Wheelchair Mobility    Modified Rankin (Stroke Patients Only)       Balance Overall balance assessment:  (needs RW for safe amb at this time)                                           Pertinent Vitals/Pain Pain Assessment: 0-10 Pain Score: 3  Pain Location: R chest    Home Living Family/patient expects to be discharged to:: Assisted living (pt resides at [facility])               Home Equipment: None Additional Comments: pt was indep with all ADLs and amb without AD    Prior Function Level of Independence: Independent         Comments: facility did provide meals     Hand Dominance   Dominant Hand: Right    Extremity/Trunk Assessment   Upper Extremity Assessment: Overall WFL for tasks assessed (R UE not formally tested due to R VATS)           Lower Extremity Assessment: Overall WFL for tasks assessed      Cervical / Trunk Assessment: Normal  Communication   Communication: No difficulties  Cognition Arousal/Alertness: Awake/alert Behavior During Therapy: WFL for tasks assessed/performed Overall Cognitive Status: Within Functional Limits for tasks assessed  General Comments General comments (skin integrity, edema, etc.): pt s/p vats.    Exercises        Assessment/Plan    PT Assessment Patient needs continued PT services  PT Diagnosis Difficulty walking;Generalized weakness   PT Problem List Decreased strength;Decreased range of motion;Decreased activity tolerance;Decreased balance;Decreased mobility  PT Treatment Interventions DME instruction;Gait training;Functional mobility training;Therapeutic activities;Therapeutic exercise   PT Goals (Current goals can be found in the Care Plan section) Acute Rehab PT Goals Patient Stated Goal: home PT Goal Formulation: With patient Time For Goal  Achievement: 01/16/16 Potential to Achieve Goals: Good    Frequency Min 3X/week   Barriers to discharge Decreased caregiver support unsure if staff can provide 24/7 supervision    Co-evaluation               End of Session Equipment Utilized During Treatment: Gait belt Activity Tolerance: Patient tolerated treatment well Patient left: in bed;with call bell/phone within reach;with chair alarm set Nurse Communication: Mobility status         Time: 9562-13081403-1415 PT Time Calculation (min) (ACUTE ONLY): 12 min   Charges:   PT Evaluation $PT Eval Low Complexity: 1 Procedure     PT G CodesMarcene Brawn:        Jazzmyne Rasnick Marie 01/02/2016, 2:32 PM   Lewis ShockAshly Selwyn Reason, PT, DPT Pager #: [phone] Office #: [phone]

## 2016-01-02 NOTE — Care Management Important Message (Signed)
Important Message  Patient Details  Name: Ian Carter MRN: [id number] Date of Birth: May 12, 1951   Medicare Important Message Given:  Yes    Kyla BalzarineShealy, Gudrun Axe Abena 01/02/2016, 11:36 AM

## 2016-01-03 LAB — CBC
HEMATOCRIT: 29.8 % — AB (ref 39.0–52.0)
HEMOGLOBIN: 9.6 g/dL — AB (ref 13.0–17.0)
MCH: 28.5 pg (ref 26.0–34.0)
MCHC: 32.2 g/dL (ref 30.0–36.0)
MCV: 88.4 fL (ref 78.0–100.0)
Platelets: 473 10*3/uL — ABNORMAL HIGH (ref 150–400)
RBC: 3.37 MIL/uL — ABNORMAL LOW (ref 4.22–5.81)
RDW: 13 % (ref 11.5–15.5)
WBC: 11.5 10*3/uL — AB (ref 4.0–10.5)

## 2016-01-03 LAB — BASIC METABOLIC PANEL
ANION GAP: 11 (ref 5–15)
BUN: 18 mg/dL (ref 6–20)
CALCIUM: 8.9 mg/dL (ref 8.9–10.3)
CO2: 27 mmol/L (ref 22–32)
CREATININE: 1.8 mg/dL — AB (ref 0.61–1.24)
Chloride: 103 mmol/L (ref 101–111)
GFR calc non Af Amer: 38 mL/min — ABNORMAL LOW (ref >60)
GFR, EST AFRICAN AMERICAN: 44 mL/min — AB (ref >60)
Glucose, Bld: 104 mg/dL — ABNORMAL HIGH (ref 65–99)
Potassium: 4.3 mmol/L (ref 3.5–5.1)
SODIUM: 141 mmol/L (ref 135–145)

## 2016-01-03 MED ORDER — OXYCODONE HCL 5 MG PO TABS
5.0000 mg | ORAL_TABLET | ORAL | Status: DC | PRN
  Administered 2016-01-03: 5 mg via ORAL
  Filled 2016-01-03: qty 1

## 2016-01-03 MED ORDER — ALBUTEROL SULFATE (2.5 MG/3ML) 0.083% IN NEBU: 2.5000 mg | INHALATION_SOLUTION | RESPIRATORY_TRACT | Status: DC | PRN

## 2016-01-03 MED ORDER — PANTOPRAZOLE SODIUM 40 MG PO TBEC: 40.0000 mg | DELAYED_RELEASE_TABLET | Freq: Every day | ORAL | Status: DC

## 2016-01-03 MED ORDER — OXYCODONE HCL 5 MG PO TABS: 5.0000 mg | ORAL_TABLET | ORAL | Status: DC | PRN

## 2016-01-03 MED ORDER — CIPROFLOXACIN HCL 500 MG PO TABS: 500.0000 mg | ORAL_TABLET | Freq: Two times a day (BID) | ORAL | Status: DC

## 2016-01-03 NOTE — Care Management Note (Signed)
Case Management Note  Patient Details  Name: Ian Carter MRN: [id number] Date of Birth: [birth year]  Subjective/Objective:    NCM spoke with [name] [facility], [facility].  Also he can provide transport for patient if he is dc after 3 pm, but if he is dc before 3 pm he will have to see if his brother can transport him back to group home.  Patient stated yesterday that he is ok with whom ever the [facility].  [facility]  Also Jermaine with [facility] will bring rolling walker up to patients room.                  Action/Plan:   Expected Discharge Date:  12/28/15               Expected Discharge Plan:  Group Home [facility])  In-House Referral:  Clinical Social Work  Discharge planning Services     Post Acute Care Choice:    Choice offered to:  Patient  DME Arranged:    DME Agency:     HH Arranged:  PT HH Agency:  [facility]  Status of Service:  Completed, signed off  Medicare Important Message Given:  Yes Date Medicare IM Given:    Medicare IM give by:    Date Additional Medicare IM Given:    Additional Medicare Important Message give by:     If discussed at Long Length of Stay Meetings, dates discussed:    Additional Comments:  Leone Havenaylor, Jariah Tarkowski Clinton, RN 01/03/2016, 8:50 AM

## 2016-01-03 NOTE — Progress Notes (Signed)
      [address]       [place] [id number]             [phone]      C/o incisional pain  BP 122/80 mmHg  Pulse 90  Temp(Src) 98.6 F (37 C) (Oral)  Resp 18  Ht 6' (1.829 m)  Wt 178 lb 2.1 oz (80.8 kg)  BMI 24.15 kg/m2  SpO2 94%   Intake/Output Summary (Last 24 hours) at 01/03/16 0837 Last data filed at 01/03/16 0032  Gross per 24 hour  Intake    720 ml  Output   1150 ml  Net   -430 ml    Incision healing well.  Doing well from a surgical standpoint  Salvatore DecentSteven C. Dorris FetchHendrickson, MD [facility] [phone]

## 2016-01-03 NOTE — Progress Notes (Signed)
PT Cancellation Note  Patient Details Name: Ian Carter MRN: [id number] DOB: 01/31/1951   Cancelled Treatment:    Reason Eval/Treat Not Completed: Patient declined, no reason specified.  Pt saying, "I'm fine, I can walk around" and declines therapy at this time.  [name] PT, DPT  Pager: [phone] Phone: [phone] 01/03/2016, 2:23 PM

## 2016-01-03 NOTE — Discharge Instructions (Signed)
Pleural Effusion °A pleural effusion is an abnormal buildup of fluid in the layers of tissue between your lungs and the inside of your chest (pleural space). These two layers of tissue that line both your lungs and the inside of your chest are called pleura. Usually, there is no air in the space between the pleura, only a thin layer of fluid. If left untreated, a large amount of fluid can build up and cause the lung to collapse. A pleural effusion is usually caused by another disease that requires treatment. °The two main types of pleural effusion are: °· Transudative pleural effusion. This happens when fluid leaks into the pleural space because of a low protein count in your blood or high blood pressure in your vessels. Heart failure often causes this. °· Exudative infusion. This occurs when fluid collects in the pleural space from blocked blood vessels or lymph vessels. Some lung diseases, injuries, and cancers can cause this type of effusion. °CAUSES °Pleural effusion can be caused by: °· Heart failure. °· A blood clot in the lung (pulmonary embolism). °· Pneumonia. °· Cancer. °· Liver failure (cirrhosis). °· Kidney disease. °· Complications from surgery, such as from open heart surgery. °SIGNS AND SYMPTOMS °In some cases, pleural effusion may cause no symptoms. Symptoms can include: °· Shortness of breath, especially when lying down. °· Chest pain, often worse when taking a deep breath. °· Fever. °· Dry cough that is lasting (chronic). °· Hiccups. °· Rapid breathing. °An underlying condition that is causing the pleural effusion (such as heart failure, pneumonia, blood clots, tuberculosis, or cancer) may also cause additional symptoms. °DIAGNOSIS °Your health care provider may suspect pleural effusion based on your symptoms and medical history. Your health care provider will also do a physical exam and a chest X-ray. If the X-ray shows there is fluid in your chest, you may need to have this fluid removed using a  needle (thoracentesis) so it can be tested. °You may also have: °· Imaging studies of the chest, such as: °¨ Ultrasound. °¨ CT scan. °· Blood tests for kidney and liver function. °TREATMENT °Treatment depends on the cause of the pleural effusion. Treatment may include: °· Taking antibiotic medicines to clear up an infection that is causing the pleural effusion. °· Placing a tube in the chest to drain the effusion (tube thoracostomy). This procedure is often used when there is an infection in the fluid. °· Surgery to remove the fibrous outer layer of tissue from the pleural space (decortication). °· Thoracentesis, which can improve cough and shortness of breath. °· A procedure to put medicine into the chest cavity to seal the pleural space to prevent fluid buildup (pleurodesis). °· Chemotherapy and radiation therapy. These may be required in the case of cancerous (malignant) pleural effusion. °HOME CARE INSTRUCTIONS °· Take medicines only as directed by your health care provider. °· Keep track of how long you can gently exercise before you get short of breath. Try simply walking at first. °· Do not use any tobacco products, including cigarettes, chewing tobacco, or electronic cigarettes. If you need help quitting, ask your health care provider. °· Keep all follow-up visits as directed by your health care provider. This is important. °SEEK MEDICAL CARE IF: °· The amount of time that you are able to exercise decreases or does not improve with time. °· You have pain or signs of infection at the puncture site if you had thoracentesis. Watch for: °¨ Drainage. °¨ Redness. °¨ Swelling. °· You have a fever. °  SEEK IMMEDIATE MEDICAL CARE IF: °· You are short of breath. °· You develop chest pain. °· You develop a new cough. °MAKE SURE YOU: °· Understand these instructions. °· Will watch your condition. °· Will get help right away if you are not doing well or get worse. °  °This information is not intended to replace advice  given to you by your health care provider. Make sure you discuss any questions you have with your health care provider. °  °Document Released: 09/17/2005 Document Revised: 10/08/2014 Document Reviewed: 02/10/2014 °[employer] Interactive Patient Education ©2016 [employer] ° °

## 2016-01-03 NOTE — Discharge Summary (Signed)
Physician Discharge Summary  Ian Carter OEV:[id number] DOB: Jun 14, 1951 DOA: 12/20/2015  PCP: No primary care provider on file.  Admit date: 12/20/2015 Discharge date: 01/03/2016  Recommendations for Outpatient Follow-up:  1. Pt will need to follow up with PCP in 1-2 weeks post discharge 2. Please obtain BMP to evaluate electrolytes and kidney function 3. Please also check CBC to evaluate Hg and Hct levels 4. Needs to continue Cipro for 2 more weeks post discharge   Discharge Diagnoses:  Principal Problem:   CAP (community acquired pneumonia) Active Problems:   HTN (hypertension)   HLD (hyperlipidemia)   Pleural effusion   AKI (acute kidney injury) ([place])   Loculated pleural effusion   Empyema lung (HCC)  Discharge Condition: Stable  Diet recommendation: Heart healthy diet discussed in details    Brief narrative:    65 year old male, supposedly resident of a group home, history of schizophrenia, HTN, HLD, presented to ED with right-sided pleuritic type of chest pain, cough, some dyspnea. In the ED, febrile 102F and chest x-ray with right-sided infiltrate with effusion. CT chest confirmed RLL PNA & moderate R sided pleural effusion. Admitted for community-acquired pneumonia and right-sided pleural effusion. Pulmonology was consulted due to worsening right-sided pleural effusion. IR was consulted and patient is S/P CT-guided right thoracentesis by IR on 3/26. As per pulmonary follow-up, patient has complicated right pleural effusion which is loculated and cannot be drained by catheter alone and hence TCTS was consulted for VATS procedure at [facility] 12/28/15.  Assessment/Plan:    RLL Community-acquired pneumonia with complicated pleural effusion / empyema  - Influenza PCR, Urine Legionella/Strep all negative, blood Cx x 2 neg - CT chest 3/22: RLL consolidation compatible with pneumonia, superimposed moderate-sized right pleural effusion - S/P CT-guided right  thoracentesis and placement of drainage catheter by IR on 3/26 - S/P R VATS decortication for empyema, post op day #4, tissue culture with g- rods, final report pending  - transitioned from vanc and zosyn to zithro and rocephin, now on oral Cipro for two more weeks  - close monitoring of WBC, overall trending down  - CT tube pulled out, pt clear for d/c from surgery standpoint, plan on d/c today   Leukocytosis, ? SIRS, post op  - pt met criteria - post op related, overall improving   Essential hypertension - continue Norvasc and HCTZ as per home medical regimen   Hyperlipidemia - Continue statin  Schizophrenia - stable at this time   Acute on Stage III chronic kidney disease - Baseline creatinine unknown-may be 1.3-1.4 range - will need close follow up   Anemia of chronic disease  - Stable, no signs of bleeding    Code Status: Full.  Family Communication: plan of care discussed with the patient Disposition Plan: group home  IV access:  Peripheral IV  Procedures and diagnostic studies:   Dg Chest 1 View 12/25/2015 Satisfactory post chest tube placement.   Ct Chest Wo Contrast 12/23/2015 Moderately large, partially loculated right pleural effusion which has greatly enlarged from 2 days ago and involves the major and minor fissures. 2. Associated compressive atelectasis as well as right middle and right lower lobe consolidation compatible with pneumonia.   US Renal 12/22/2015 Normal size kidneys without evidence of hydronephrosis. Mild increased cortical echogenicity which can be seen with medical renal disease. Three small left renal cysts.   Ct Image Guided Fluid Drain By Catheter 12/25/2015 Successful CT guided placement of a 12 French all purpose drain catheter into the right  pleural space with aspiration of approximately 220 cc of serous, non foul smelling mL of pleural fluid. Samples were sent to the laboratory as requested by the ordering clinical team.    Medical Consultants:  PCCM IR CTS  Other Consultants:  None  IAnti-Infectives:   Azithromycin 3/21 > 3/25, 3/31 --> 4/2 Rocephin 3/21 > 3/27, 3/31 --> 4/2 Cipro 4/2 --> two more weeks       Discharge Exam: Filed Vitals:   01/03/16 0305 01/03/16 0807  BP: 122/80   Pulse:    Temp: 97.8 F (36.6 C) 98.6 F (37 C)  Resp: 18    Filed Vitals:   01/02/16 1955 01/02/16 2200 01/03/16 0305 01/03/16 0807  BP: 104/62 108/67 122/80   Pulse:      Temp: 98.5 F (36.9 C) 98.1 F (36.7 C) 97.8 F (36.6 C) 98.6 F (37 C)  TempSrc: Oral Oral Oral Oral  Resp: 18 18 18    Height:      Weight:   80.8 kg (178 lb 2.1 oz)   SpO2: 97% 98% 94%     General: Pt is alert, follows commands appropriately, not in acute distress Cardiovascular: Regular rate and rhythm, S1/S2 +, no murmurs, no rubs, no gallops Respiratory:  no wheezing, no crackles, no rhonchi Abdominal: Soft, non tender, non distended, bowel sounds +, no guarding  Discharge Instructions  Discharge Instructions    Diet - low sodium heart healthy    Complete by:  As directed      Increase activity slowly    Complete by:  As directed             Medication List    STOP taking these medications        meloxicam 7.5 MG tablet  Commonly known as:  MOBIC      TAKE these medications        acetaminophen 500 MG tablet  Commonly known as:  TYLENOL  Take 500 mg by mouth every 8 (eight) hours as needed for mild pain, moderate pain or headache.     albuterol (2.5 MG/3ML) 0.083% nebulizer solution  Commonly known as:  PROVENTIL  Take 3 mLs (2.5 mg total) by nebulization every 4 (four) hours as needed for wheezing or shortness of breath.     amLODipine 5 MG tablet  Commonly known as:  NORVASC  Take 5 mg by mouth daily.     aspirin 81 MG tablet  Take 81 mg by mouth daily.     benztropine 0.5 MG tablet  Commonly known as:  COGENTIN  Take 0.5 mg by mouth 2 (two) times daily.     carbamazepine 200 MG  tablet  Commonly known as:  TEGRETOL  Take 400 mg by mouth at bedtime.     cetirizine 10 MG tablet  Commonly known as:  ZYRTEC  Take 10 mg by mouth at bedtime.     cholecalciferol 400 units Tabs tablet  Commonly known as:  VITAMIN D  Take 400 Units by mouth daily.     ciprofloxacin 500 MG tablet  Commonly known as:  CIPRO  Take 1 tablet (500 mg total) by mouth 2 (two) times daily.     docusate sodium 100 MG capsule  Commonly known as:  COLACE  Take 100 mg by mouth 2 (two) times daily.     eucerin cream  Apply 1 application topically 2 (two) times daily.     haloperidol 20 MG tablet  Commonly known as:  HALDOL  Take  20 mg by mouth at bedtime.     hydrochlorothiazide 12.5 MG capsule  Commonly known as:  MICROZIDE  Take 12.5 mg by mouth daily.     oxyCODONE 5 MG immediate release tablet  Commonly known as:  Oxy IR/ROXICODONE  Take 1-2 tablets (5-10 mg total) by mouth every 4 (four) hours as needed for moderate pain.     pantoprazole 40 MG tablet  Commonly known as:  PROTONIX  Take 1 tablet (40 mg total) by mouth daily before breakfast.     simvastatin 10 MG tablet  Commonly known as:  ZOCOR  Take 10 mg by mouth at bedtime.           Follow-up Information    Follow up with Martinique Brittle PA ([place]).   Contact information:   [phone]      Follow up with [facility].   Why:  HHPT   Contact information:   [place] [address] [phone]       Follow up with [facility].   Why:  rolling walker- [facility] will bring up to patient's room before discharge   Contact information:   [address] [place] [address] [phone]       Schedule an appointment as soon as possible for a visit with Melrose Nakayama, MD.   Specialty:  Cardiothoracic Surgery   Contact information:   [address] [place] [address] [phone]       Call Faye Ramsay,  MD.   Specialty:  Internal Medicine   Why:  As needed call my cell phone [phone]   Contact information:   [address] [place] [address] [phone]        The results of significant diagnostics from this hospitalization (including imaging, microbiology, ancillary and laboratory) are listed below for reference.     Microbiology: Recent Results (from the past 240 hour(s))  Body fluid culture     Status: None   Collection Time: 12/25/15 10:14 AM  Result Value Ref Range Status   Specimen Description PLEURAL DRAIN PLACEMENT  Final   Special Requests NONE  Final   Gram Stain   Final    CYTOSPIN SLIDE WBC PRESENT, PREDOMINANTLY PMN NO ORGANISMS SEEN    Culture   Final    NO GROWTH 3 DAYS Performed at [facility]    Report Status 12/29/2015 FINAL  Final  Fungus Culture With Stain (Not @ [facility])     Status: None (Preliminary result)   Collection Time: 12/28/15  2:38 PM  Result Value Ref Range Status   Fungus Stain Final report  Final    Comment: (NOTE) Performed At: [facility] [place], [place] [id number] Lindon Romp MD [id number]    Fungus (Mycology) Culture PENDING  Incomplete   Fungal Source TISSUE  Final    Comment: RIGHT LUNG   Acid Fast Smear (AFB)     Status: None   Collection Time: 12/28/15  2:38 PM  Result Value Ref Range Status   AFB Specimen Processing Comment  Final    Comment: Tissue Grinding and Digestion/Decontamination   Acid Fast Smear Negative  Final    Comment: (NOTE) Performed At: [facility] [place], [place] [id number] Lindon Romp MD EB:[id number]    Source (AFB) TISSUE  Final    Comment: RIGHT LUNG   Culture, body fluid-bottle  Status: None   Collection Time: 12/28/15  2:38 PM  Result Value Ref Range Status   Specimen Description FLUID RIGHT PLEURAL  Final   Special Requests   Final    PATIENT ON FOLLOWING CEFTRIAXONE  ZITHROMAX AND VANC    Culture NO GROWTH 5 DAYS  Final   Report Status 01/02/2016 FINAL  Final  Acid Fast Smear (AFB)     Status: None   Collection Time: 12/28/15  2:38 PM  Result Value Ref Range Status   AFB Specimen Processing Concentration  Final   Acid Fast Smear Negative  Final    Comment: (NOTE) Performed At: [facility] [address] [place], [place] [id number] Lindon Romp MD [id number]    Source (AFB) FLUID  Final    Comment: RIGHT PLEURAL   Fungus Culture With Stain (Not @ [facility])     Status: None (Preliminary result)   Collection Time: 12/28/15  2:38 PM  Result Value Ref Range Status   Fungus Stain Final report  Final    Comment: (NOTE) Performed At: [facility] [place], [place] [id number] Lindon Romp MD PJ:[id number]    Fungus (Mycology) Culture PENDING  Incomplete   Fungal Source FLUID  Final    Comment: RIGHT PLEURAL   Gram stain     Status: None   Collection Time: 12/28/15  2:38 PM  Result Value Ref Range Status   Specimen Description FLUID RIGHT PLEURAL  Final   Special Requests   Final    PATIENT ON FOLLOWING CEFTRIAXONE ZITHROMAX AND VANC   Gram Stain   Final    FEW WBC PRESENT,BOTH PMN AND MONONUCLEAR NO ORGANISMS SEEN    Report Status 12/28/2015 FINAL  Final  Fungus Culture Result     Status: None   Collection Time: 12/28/15  2:38 PM  Result Value Ref Range Status   Result 1 Comment  Final    Comment: (NOTE) KOH/Calcofluor preparation:  no fungus observed. Performed At: [facility] [place], [place] [id number] Lindon Romp MD NK:[id number]   Fungus Culture Result     Status: None   Collection Time: 12/28/15  2:38 PM  Result Value Ref Range Status   Result 1 Comment  Final    Comment: (NOTE) KOH/Calcofluor preparation:  no fungus observed. Performed At: [facility] [place], [place] [id number] Lindon Romp MD DZ:[id number]   Tissue culture     Status: None    Collection Time: 12/28/15  2:41 PM  Result Value Ref Range Status   Specimen Description TISSUE RIGHT LUNG  Final   Special Requests   Final    RIGHT PARIETAL PEEL PATIENT ON FOLLOWING VANC ZITHROMAX AND CEFTRIAXONE   Gram Stain   Final    NO WBC SEEN NO ORGANISMS SEEN Performed at [facility]    Culture   Final    RARE SERRATIA MARCESCENS Performed at [facility]    Report Status 01/02/2016 FINAL  Final   Organism ID, Bacteria SERRATIA MARCESCENS  Final      Susceptibility   Serratia marcescens - MIC*    CEFAZOLIN >=64 RESISTANT Resistant     CEFEPIME <=1 SENSITIVE Sensitive     CEFTAZIDIME <=1 SENSITIVE Sensitive     CEFTRIAXONE <=1 SENSITIVE Sensitive     CIPROFLOXACIN <=0.25 SENSITIVE Sensitive     GENTAMICIN <=1 SENSITIVE Sensitive     TOBRAMYCIN 8 INTERMEDIATE Intermediate     TRIMETH/SULFA Value in next  row Sensitive      <=20 SENSITIVE(NOTE)    * RARE SERRATIA MARCESCENS  Tissue culture     Status: None   Collection Time: 12/28/15  2:41 PM  Result Value Ref Range Status   Specimen Description TISSUE RIGHT LUNG  Final   Special Requests   Final    RIGHT VICERAL PEEL PATIENT ON FOLLOWING VANC ZITHROMAX AND CEFTRIAXONE   Gram Stain   Final    FEW WBC PRESENT, PREDOMINANTLY MONONUCLEAR NO ORGANISMS SEEN Performed at [facility]    Culture   Final    RARE SERRATIA MARCESCENS Performed at [facility]    Report Status 01/01/2016 FINAL  Final   Organism ID, Bacteria SERRATIA MARCESCENS  Final      Susceptibility   Serratia marcescens - MIC*    CEFAZOLIN >=64 RESISTANT Resistant     CEFEPIME <=1 SENSITIVE Sensitive     CEFTAZIDIME <=1 SENSITIVE Sensitive     CEFTRIAXONE <=1 SENSITIVE Sensitive     CIPROFLOXACIN <=0.25 SENSITIVE Sensitive     GENTAMICIN <=1 SENSITIVE Sensitive     TOBRAMYCIN 8 INTERMEDIATE Intermediate     TRIMETH/SULFA Value in next row Sensitive      <=20 SENSITIVE(NOTE)    * RARE SERRATIA MARCESCENS  Acid  Fast Smear (AFB)     Status: None   Collection Time: 12/28/15  2:41 PM  Result Value Ref Range Status   AFB Specimen Processing Comment  Final    Comment: Tissue Grinding and Digestion/Decontamination   Acid Fast Smear Negative  Final    Comment: (NOTE) Performed At: [facility] [address] [place], [place] [id number] Lindon Romp MD FI:[id number]    Source (AFB) TISSUE  Final    Comment: RIGHT LUNG   Fungus Culture With Stain (Not @ [facility])     Status: None (Preliminary result)   Collection Time: 12/28/15  2:41 PM  Result Value Ref Range Status   Fungus Stain Final report  Final    Comment: (NOTE) Performed At: [facility] [place], [place] [id number] Lindon Romp MD [id number]:[id number]    Fungus (Mycology) Culture PENDING  Incomplete   Fungal Source TISSUE  Final    Comment: RIGHT LUNG   Fungus Culture Result     Status: None   Collection Time: 12/28/15  2:41 PM  Result Value Ref Range Status   Result 1 Comment  Final    Comment: (NOTE) KOH/Calcofluor preparation:  no fungus observed. Performed At: [facility] [place], [place] [id number] Lindon Romp MD [id number]   MRSA PCR Screening     Status: None   Collection Time: 12/28/15 11:00 PM  Result Value Ref Range Status   MRSA by PCR NEGATIVE NEGATIVE Final    Comment:        The GeneXpert MRSA Assay (FDA approved for NASAL specimens only), is one component of a comprehensive MRSA colonization surveillance program. It is not intended to diagnose MRSA infection nor to guide or monitor treatment for MRSA infections.      Labs: Basic Metabolic Panel:  Recent Labs Lab 12/30/15 0300 12/30/15 1929 12/31/15 0313 01/01/16 0418 01/02/16 0502 01/03/16 0335  NA 140 142 142 142 142 141  K 4.1 3.9 4.0 4.2 4.1 4.3  CL 107  --  108 107 106 103  CO2 26  --  26 27 28 27   GLUCOSE 144* 145* 158* 103* 105* 104*  BUN 17  --  19  14 17 18   CREATININE  1.79*  --  1.73* 1.61* 1.75* 1.80*  CALCIUM 8.6*  --  8.8* 9.2 9.2 8.9   Liver Function Tests:  Recent Labs Lab 12/27/15 2140 12/30/15 0300  AST 20 25  ALT 23 25  ALKPHOS 115 91  BILITOT 0.4 0.6  PROT 6.2* 6.1*  ALBUMIN 2.0* 2.0*   CBC:  Recent Labs Lab 12/30/15 0300 12/30/15 1929 12/31/15 0313 01/01/16 0418 01/02/16 0502 01/03/16 0335  WBC 22.6*  --  16.6* 12.6* 10.9* 11.5*  HGB 10.4* 12.9* 9.7* 9.6* 9.8* 9.6*  HCT 32.0* 38.0* 28.7* 30.1* 30.9* 29.8*  MCV 88.4  --  88.6 88.8 89.0 88.4  PLT 400  --  392 483* 498* 473*    SIGNED: Time coordinating discharge: 30 minutes  MAGICK-Pranit Owensby, MD  [facility] 01/03/2016, 11:10 AM Pager [phone]  If 7PM-7AM, please contact night-coverage [email or web address] Password [email or web address]

## 2016-01-10 ENCOUNTER — Ambulatory Visit (INDEPENDENT_AMBULATORY_CARE_PROVIDER_SITE_OTHER): Payer: Self-pay | Admitting: *Deleted

## 2016-01-10 DIAGNOSIS — J869 Pyothorax without fistula: Secondary | ICD-10-CM

## 2016-01-10 DIAGNOSIS — J90 Pleural effusion, not elsewhere classified: Secondary | ICD-10-CM

## 2016-01-10 DIAGNOSIS — Z09 Encounter for follow-up examination after completed treatment for conditions other than malignant neoplasm: Secondary | ICD-10-CM

## 2016-01-10 DIAGNOSIS — Z4802 Encounter for removal of sutures: Secondary | ICD-10-CM

## 2016-01-11 NOTE — Progress Notes (Signed)
[name] returns s/p thoracic surgery for removal of sutures from his previous two chest tube sites. These were easily removed. These sites as well as the other VATS incisions are all very well healed.  He is doing well and not requiring narcotic pain med, only Tylenol. He will return as scheduled with a chest xray.

## 2016-01-23 ENCOUNTER — Other Ambulatory Visit: Payer: Self-pay | Admitting: Thoracic Surgery (Cardiothoracic Vascular Surgery)

## 2016-01-23 DIAGNOSIS — J869 Pyothorax without fistula: Secondary | ICD-10-CM

## 2016-01-24 ENCOUNTER — Ambulatory Visit
Admission: RE | Admit: 2016-01-24 | Discharge: 2016-01-24 | Disposition: A | Discharge status: Home / Self Care | Payer: Medicare Other | Source: Ambulatory Visit | Attending: Thoracic Surgery (Cardiothoracic Vascular Surgery) | Admitting: Thoracic Surgery (Cardiothoracic Vascular Surgery)

## 2016-01-24 ENCOUNTER — Ambulatory Visit: Payer: Self-pay | Admitting: Thoracic Surgery (Cardiothoracic Vascular Surgery)

## 2016-01-24 DIAGNOSIS — J869 Pyothorax without fistula: Secondary | ICD-10-CM

## 2016-01-30 ENCOUNTER — Other Ambulatory Visit: Payer: Self-pay | Admitting: Thoracic Surgery (Cardiothoracic Vascular Surgery)

## 2016-01-30 DIAGNOSIS — J869 Pyothorax without fistula: Secondary | ICD-10-CM

## 2016-01-31 ENCOUNTER — Encounter: Payer: Self-pay | Admitting: Thoracic Surgery (Cardiothoracic Vascular Surgery)

## 2016-01-31 ENCOUNTER — Ambulatory Visit (INDEPENDENT_AMBULATORY_CARE_PROVIDER_SITE_OTHER): Payer: Self-pay | Admitting: Thoracic Surgery (Cardiothoracic Vascular Surgery)

## 2016-01-31 VITALS — BP 120/78 | HR 80 | Resp 20 | Ht 74.0 in | Wt 209.0 lb

## 2016-01-31 DIAGNOSIS — J869 Pyothorax without fistula: Secondary | ICD-10-CM

## 2016-01-31 DIAGNOSIS — J90 Pleural effusion, not elsewhere classified: Secondary | ICD-10-CM

## 2016-01-31 DIAGNOSIS — J948 Other specified pleural conditions: Secondary | ICD-10-CM

## 2016-01-31 DIAGNOSIS — F209 Schizophrenia, unspecified: Secondary | ICD-10-CM | POA: Insufficient documentation

## 2016-01-31 LAB — FUNGUS CULTURE WITH STAIN

## 2016-01-31 LAB — FUNGAL ORGANISM REFLEX

## 2016-01-31 LAB — FUNGUS CULTURE RESULT

## 2016-01-31 NOTE — Progress Notes (Signed)
[address]       Ian Carter [address]             [phone]       HPI: Ian Carter returns for a scheduled postoperative follow-up visit.  He is a 65 year old man with history of tobacco abuse and schizophrenia who was admitted with an empyema in late March. He underwent right VATS, drainage of the empyema, and decortication on 12/28/2015. He was discharged on postoperative day #5.  He feels well. He does have some incisional discomfort, but is not taking any narcotics. He is not having any problems with cough, hemoptysis, wheezing, or shortness of breath.  Past Medical History  Diagnosis Date  . Schizophrenia (HCC)   . Anxiety   . Seizures (HCC)   . Hypertension   . Dyslipidemia       Current Outpatient Prescriptions  Medication Sig Dispense Refill  . acetaminophen (TYLENOL) 500 MG tablet Take 500 mg by mouth every 8 (eight) hours as needed for mild pain, moderate pain or headache.    . albuterol (PROVENTIL) (2.5 MG/3ML) 0.083% nebulizer solution Take 3 mLs (2.5 mg total) by nebulization every 4 (four) hours as needed for wheezing or shortness of breath. 75 mL 3  . amLODipine (NORVASC) 5 MG tablet Take 5 mg by mouth daily.    Marland Kitchen aspirin 81 MG tablet Take 81 mg by mouth daily.    . benztropine (COGENTIN) 0.5 MG tablet Take 0.5 mg by mouth 2 (two) times daily.    . carbamazepine (TEGRETOL) 200 MG tablet Take 400 mg by mouth at bedtime.    . cetirizine (ZYRTEC) 10 MG tablet Take 10 mg by mouth at bedtime.    . cholecalciferol (VITAMIN D) 400 units TABS tablet Take 400 Units by mouth daily.    Marland Kitchen docusate sodium (COLACE) 100 MG capsule Take 100 mg by mouth 2 (two) times daily.    . haloperidol (HALDOL) 20 MG tablet Take 20 mg by mouth at bedtime.    . hydrochlorothiazide (MICROZIDE) 12.5 MG capsule Take 12.5 mg by mouth daily.    Marland Kitchen oxyCODONE (OXY IR/ROXICODONE) 5 MG immediate release tablet Take 1-2 tablets (5-10 mg total) by mouth every 4 (four) hours as  needed for moderate pain. 30 tablet 0  . pantoprazole (PROTONIX) 40 MG tablet Take 1 tablet (40 mg total) by mouth daily before breakfast. 30 tablet 0  . simvastatin (ZOCOR) 10 MG tablet Take 10 mg by mouth at bedtime.    . Skin Protectants, Misc. (EUCERIN) cream Apply 1 application topically 2 (two) times daily.     No current facility-administered medications for this visit.    Physical Exam BP 120/78 mmHg  Pulse 80  Resp 20  Ht  (1.88 m)  Wt 209 lb (94.802 kg)  BMI 26.82 kg/m2  SpO32 2% 64 year old man in no acute distress Alert and oriented 3 with no focal deficits Lungs clear with equal breath sounds bilaterally Incisions well healed  Diagnostic Tests: CHEST 2 VIEW  COMPARISON: 01/02/2016.  FINDINGS: Mediastinum and hilar structures normal. Right base pleural parenchymal thickening noted consistent with scarring. Left lung is clear. Heart size normal. No pleural effusion or pneumothorax. No acute bony abnormality .  IMPRESSION: Right base pleural parenchymal thickening again noted. Findings most consistent with scarring. Small residual right base infiltrate and pleural effusion cannot be excluded . No acute abnormality otherwise identified.   Electronically Signed  By: Maisie Fus Register  On: 01/24/2016 12:33  I personally reviewed the chest x-ray. This is an excellent appearance after decortication.  Impression: 65 year old man who had a thoracoscopic decortication for an empyema about a month ago. He is doing extremely well at this time. He still has some incisional discomfort but is not requiring any narcotics at present.  He asked about using vitamin E on his incisions. I have no problem with that.  There are no restrictions on his activities from my standpoint  Plan: I will be happy to see [name] back at any time in the future if I can be of any further assistance with his care.  Loreli SlotSteven C Acsa Estey, MD [facility] [phone]

## 2016-02-03 LAB — FUNGUS CULTURE WITH STAIN

## 2016-02-03 LAB — FUNGAL ORGANISM REFLEX

## 2016-02-03 LAB — FUNGUS CULTURE RESULT

## 2016-02-10 LAB — ACID FAST CULTURE WITH REFLEXED SENSITIVITIES (MYCOBACTERIA)
Acid Fast Culture: NEGATIVE
Acid Fast Culture: NEGATIVE
Acid Fast Culture: NEGATIVE

## 2016-12-09 ENCOUNTER — Observation Stay (HOSPITAL_COMMUNITY)
Admission: EM | Admit: 2016-12-09 | Discharge: 2016-12-10 | Disposition: A | Discharge status: Home / Self Care | Payer: Medicare Other | Attending: Internal Medicine | Admitting: Internal Medicine

## 2016-12-09 ENCOUNTER — Encounter (HOSPITAL_COMMUNITY): Payer: Self-pay | Admitting: Emergency Medicine

## 2016-12-09 ENCOUNTER — Observation Stay (HOSPITAL_BASED_OUTPATIENT_CLINIC_OR_DEPARTMENT_OTHER): Payer: Medicare Other

## 2016-12-09 ENCOUNTER — Emergency Department (HOSPITAL_COMMUNITY): Payer: Medicare Other

## 2016-12-09 DIAGNOSIS — Z7982 Long term (current) use of aspirin: Secondary | ICD-10-CM | POA: Diagnosis not present

## 2016-12-09 DIAGNOSIS — F209 Schizophrenia, unspecified: Secondary | ICD-10-CM | POA: Diagnosis not present

## 2016-12-09 DIAGNOSIS — J811 Chronic pulmonary edema: Secondary | ICD-10-CM | POA: Diagnosis not present

## 2016-12-09 DIAGNOSIS — I509 Heart failure, unspecified: Secondary | ICD-10-CM

## 2016-12-09 DIAGNOSIS — Z79899 Other long term (current) drug therapy: Secondary | ICD-10-CM | POA: Diagnosis not present

## 2016-12-09 DIAGNOSIS — F17290 Nicotine dependence, other tobacco product, uncomplicated: Secondary | ICD-10-CM | POA: Insufficient documentation

## 2016-12-09 DIAGNOSIS — J810 Acute pulmonary edema: Secondary | ICD-10-CM

## 2016-12-09 DIAGNOSIS — K219 Gastro-esophageal reflux disease without esophagitis: Secondary | ICD-10-CM | POA: Diagnosis not present

## 2016-12-09 DIAGNOSIS — R072 Precordial pain: Secondary | ICD-10-CM

## 2016-12-09 DIAGNOSIS — E785 Hyperlipidemia, unspecified: Secondary | ICD-10-CM | POA: Diagnosis not present

## 2016-12-09 DIAGNOSIS — J181 Lobar pneumonia, unspecified organism: Secondary | ICD-10-CM | POA: Diagnosis not present

## 2016-12-09 DIAGNOSIS — I110 Hypertensive heart disease with heart failure: Secondary | ICD-10-CM | POA: Insufficient documentation

## 2016-12-09 DIAGNOSIS — I10 Essential (primary) hypertension: Secondary | ICD-10-CM | POA: Diagnosis present

## 2016-12-09 DIAGNOSIS — R079 Chest pain, unspecified: Secondary | ICD-10-CM

## 2016-12-09 DIAGNOSIS — J189 Pneumonia, unspecified organism: Secondary | ICD-10-CM | POA: Diagnosis present

## 2016-12-09 LAB — LIPASE, BLOOD: Lipase: 13 U/L (ref 11–51)

## 2016-12-09 LAB — CBC
HCT: 44.1 % (ref 39.0–52.0)
Hemoglobin: 15 g/dL (ref 13.0–17.0)
MCH: 30.2 pg (ref 26.0–34.0)
MCHC: 34 g/dL (ref 30.0–36.0)
MCV: 88.9 fL (ref 78.0–100.0)
PLATELETS: 227 10*3/uL (ref 150–400)
RBC: 4.96 MIL/uL (ref 4.22–5.81)
RDW: 13.9 % (ref 11.5–15.5)
WBC: 17.3 10*3/uL — ABNORMAL HIGH (ref 4.0–10.5)

## 2016-12-09 LAB — CBC WITH DIFFERENTIAL/PLATELET
BASOS ABS: 0 10*3/uL (ref 0.0–0.1)
Basophils Relative: 0 %
EOS ABS: 0 10*3/uL (ref 0.0–0.7)
Eosinophils Relative: 0 %
HCT: 43.5 % (ref 39.0–52.0)
HEMOGLOBIN: 14.8 g/dL (ref 13.0–17.0)
LYMPHS ABS: 1.5 10*3/uL (ref 0.7–4.0)
LYMPHS PCT: 9 %
MCH: 30.1 pg (ref 26.0–34.0)
MCHC: 34 g/dL (ref 30.0–36.0)
MCV: 88.6 fL (ref 78.0–100.0)
Monocytes Absolute: 0.9 10*3/uL (ref 0.1–1.0)
Monocytes Relative: 6 %
NEUTROS PCT: 85 %
Neutro Abs: 13.8 10*3/uL — ABNORMAL HIGH (ref 1.7–7.7)
Platelets: 174 10*3/uL (ref 150–400)
RBC: 4.91 MIL/uL (ref 4.22–5.81)
RDW: 13.8 % (ref 11.5–15.5)
WBC: 16.2 10*3/uL — AB (ref 4.0–10.5)

## 2016-12-09 LAB — I-STAT TROPONIN, ED: TROPONIN I, POC: 0.01 ng/mL (ref 0.00–0.08)

## 2016-12-09 LAB — TROPONIN I: Troponin I: <0.03 ng/mL (ref <0.03)

## 2016-12-09 LAB — ECHOCARDIOGRAM COMPLETE
Height: 74 in
Weight: 3174.62 oz

## 2016-12-09 LAB — COMPREHENSIVE METABOLIC PANEL
ALK PHOS: 121 U/L (ref 38–126)
ALT: 15 U/L — AB (ref 17–63)
AST: 17 U/L (ref 15–41)
Albumin: 3.6 g/dL (ref 3.5–5.0)
Anion gap: 9 (ref 5–15)
BUN: 16 mg/dL (ref 6–20)
CALCIUM: 9 mg/dL (ref 8.9–10.3)
CO2: 26 mmol/L (ref 22–32)
CREATININE: 1.39 mg/dL — AB (ref 0.61–1.24)
Chloride: 104 mmol/L (ref 101–111)
GFR calc non Af Amer: 52 mL/min — ABNORMAL LOW (ref >60)
Glucose, Bld: 91 mg/dL (ref 65–99)
Potassium: 4.1 mmol/L (ref 3.5–5.1)
SODIUM: 139 mmol/L (ref 135–145)
Total Bilirubin: 0.5 mg/dL (ref 0.3–1.2)
Total Protein: 6.7 g/dL (ref 6.5–8.1)

## 2016-12-09 LAB — CREATININE, SERUM
CREATININE: 1.28 mg/dL — AB (ref 0.61–1.24)
GFR calc Af Amer: >60 mL/min (ref >60)
GFR, EST NON AFRICAN AMERICAN: 58 mL/min — AB (ref >60)

## 2016-12-09 LAB — BRAIN NATRIURETIC PEPTIDE: B Natriuretic Peptide: 36.1 pg/mL (ref 0.0–100.0)

## 2016-12-09 MED ORDER — CARBAMAZEPINE 200 MG PO TABS
200.0000 mg | ORAL_TABLET | Freq: Every morning | ORAL | Status: DC
  Administered 2016-12-10: 200 mg via ORAL
  Filled 2016-12-09: qty 1

## 2016-12-09 MED ORDER — SODIUM CHLORIDE 0.9 % IV SOLN
INTRAVENOUS | Status: DC
  Administered 2016-12-10: 01:00:00 via INTRAVENOUS

## 2016-12-09 MED ORDER — CHOLECALCIFEROL 10 MCG (400 UNIT) PO TABS
400.0000 [IU] | ORAL_TABLET | Freq: Every day | ORAL | Status: DC
  Administered 2016-12-10: 400 [IU] via ORAL
  Filled 2016-12-09: qty 1

## 2016-12-09 MED ORDER — DEXTROSE 5 % IV SOLN
1.0000 g | INTRAVENOUS | Status: DC
  Administered 2016-12-09: 1 g via INTRAVENOUS
  Filled 2016-12-09: qty 10

## 2016-12-09 MED ORDER — ALBUTEROL SULFATE HFA 108 (90 BASE) MCG/ACT IN AERS: 1.0000 | INHALATION_SPRAY | Freq: Four times a day (QID) | RESPIRATORY_TRACT | Status: DC | PRN

## 2016-12-09 MED ORDER — HYDROCHLOROTHIAZIDE 12.5 MG PO CAPS
12.5000 mg | ORAL_CAPSULE | Freq: Every day | ORAL | Status: DC
  Administered 2016-12-10: 12.5 mg via ORAL
  Filled 2016-12-09: qty 1

## 2016-12-09 MED ORDER — SIMVASTATIN 20 MG PO TABS
10.0000 mg | ORAL_TABLET | Freq: Every day | ORAL | Status: DC
  Administered 2016-12-09: 10 mg via ORAL
  Filled 2016-12-09: qty 1

## 2016-12-09 MED ORDER — CARBAMAZEPINE 200 MG PO TABS: 200.0000 mg | ORAL_TABLET | Freq: Two times a day (BID) | ORAL | Status: DC

## 2016-12-09 MED ORDER — LORATADINE 10 MG PO TABS
10.0000 mg | ORAL_TABLET | Freq: Every day | ORAL | Status: DC
  Administered 2016-12-10: 10 mg via ORAL
  Filled 2016-12-09: qty 1

## 2016-12-09 MED ORDER — HEPARIN SODIUM (PORCINE) 5000 UNIT/ML IJ SOLN
5000.0000 [IU] | Freq: Three times a day (TID) | INTRAMUSCULAR | Status: DC
  Administered 2016-12-09 – 2016-12-10 (×3): 5000 [IU] via SUBCUTANEOUS
  Filled 2016-12-09 (×3): qty 1

## 2016-12-09 MED ORDER — BISACODYL 5 MG PO TBEC: 5.0000 mg | DELAYED_RELEASE_TABLET | Freq: Every day | ORAL | Status: DC | PRN

## 2016-12-09 MED ORDER — HALOPERIDOL 5 MG PO TABS
20.0000 mg | ORAL_TABLET | Freq: Every day | ORAL | Status: DC
  Administered 2016-12-09: 20 mg via ORAL
  Filled 2016-12-09 (×2): qty 4

## 2016-12-09 MED ORDER — AMLODIPINE BESYLATE 5 MG PO TABS
5.0000 mg | ORAL_TABLET | Freq: Every day | ORAL | Status: DC
  Administered 2016-12-10: 5 mg via ORAL
  Filled 2016-12-09: qty 1

## 2016-12-09 MED ORDER — ACETAMINOPHEN 500 MG PO TABS: 500.0000 mg | ORAL_TABLET | Freq: Three times a day (TID) | ORAL | Status: DC | PRN

## 2016-12-09 MED ORDER — DOCUSATE SODIUM 100 MG PO CAPS
100.0000 mg | ORAL_CAPSULE | Freq: Two times a day (BID) | ORAL | Status: DC
  Administered 2016-12-09 – 2016-12-10 (×2): 100 mg via ORAL
  Filled 2016-12-09 (×2): qty 1

## 2016-12-09 MED ORDER — HYDROCERIN EX CREA
1.0000 "application " | TOPICAL_CREAM | Freq: Two times a day (BID) | CUTANEOUS | Status: DC
  Administered 2016-12-09 – 2016-12-10 (×2): 1 via TOPICAL
  Filled 2016-12-09: qty 113

## 2016-12-09 MED ORDER — PANTOPRAZOLE SODIUM 40 MG PO TBEC
40.0000 mg | DELAYED_RELEASE_TABLET | Freq: Every day | ORAL | Status: DC
  Administered 2016-12-10: 40 mg via ORAL
  Filled 2016-12-09: qty 1

## 2016-12-09 MED ORDER — DEXTROSE 5 % IV SOLN
500.0000 mg | INTRAVENOUS | Status: DC
  Filled 2016-12-09: qty 500

## 2016-12-09 MED ORDER — INFLUENZA VAC SPLIT QUAD 0.5 ML IM SUSY
0.5000 mL | PREFILLED_SYRINGE | INTRAMUSCULAR | Status: DC
  Filled 2016-12-09: qty 0.5

## 2016-12-09 MED ORDER — DEXTROSE 5 % IV SOLN
500.0000 mg | Freq: Once | INTRAVENOUS | Status: AC
  Administered 2016-12-09: 500 mg via INTRAVENOUS
  Filled 2016-12-09: qty 500

## 2016-12-09 MED ORDER — BENZTROPINE MESYLATE 0.5 MG PO TABS
0.5000 mg | ORAL_TABLET | Freq: Two times a day (BID) | ORAL | Status: DC
  Administered 2016-12-09 – 2016-12-10 (×2): 0.5 mg via ORAL
  Filled 2016-12-09 (×3): qty 1

## 2016-12-09 MED ORDER — CARBAMAZEPINE 200 MG PO TABS
400.0000 mg | ORAL_TABLET | Freq: Every day | ORAL | Status: DC
  Administered 2016-12-09: 400 mg via ORAL
  Filled 2016-12-09: qty 2

## 2016-12-09 MED ORDER — ALBUTEROL SULFATE (2.5 MG/3ML) 0.083% IN NEBU: 2.5000 mg | INHALATION_SOLUTION | Freq: Four times a day (QID) | RESPIRATORY_TRACT | Status: DC | PRN

## 2016-12-09 MED ORDER — SODIUM CHLORIDE 0.9% FLUSH
3.0000 mL | Freq: Two times a day (BID) | INTRAVENOUS | Status: DC
  Administered 2016-12-10: 3 mL via INTRAVENOUS

## 2016-12-09 MED ORDER — SENNOSIDES-DOCUSATE SODIUM 8.6-50 MG PO TABS: 1.0000 | ORAL_TABLET | Freq: Every evening | ORAL | Status: DC | PRN

## 2016-12-09 MED ORDER — DEXTROSE 5 % IV SOLN
1.0000 g | Freq: Once | INTRAVENOUS | Status: AC
  Administered 2016-12-09: 1 g via INTRAVENOUS
  Filled 2016-12-09: qty 10

## 2016-12-09 MED ORDER — ASPIRIN EC 81 MG PO TBEC
81.0000 mg | DELAYED_RELEASE_TABLET | Freq: Every day | ORAL | Status: DC
  Administered 2016-12-10: 81 mg via ORAL
  Filled 2016-12-09: qty 1

## 2016-12-09 NOTE — ED Notes (Signed)
Attempted report 

## 2016-12-09 NOTE — Progress Notes (Signed)
Critical troponin paged to Dr. Julien NordmannLangeland. Awaiting response.

## 2016-12-09 NOTE — ED Provider Notes (Signed)
[facility]-EM[facility]Y DEPT Provider Note   CSN: [id number] Arrival date & time: 12/09/16  0730     History   Chief Complaint Chief Complaint  Patient presents with  . Chest Pain    HPI Ian Carter is a 66 y.o. male.  Patient from a group home. Patient with onset of left substernal chest pain at 4:00 this morning. Resolved in route by EMS with aspirin and nitroglycerin. No chest pain since 7:30. They reported that the pain was increased with palpitation. Patient has cardiac risk factors to include hypertension and hyperlipidemia. Patient does have a history of schizophrenia. But seems to be cooperative alert and in no acute mental distress.      Past Medical History:  Diagnosis Date  . Anxiety   . Dyslipidemia   . Hypertension   . Schizophrenia (HCC)   . Seizures [facility])     Patient Active Problem List   Diagnosis Date Noted  . Chest pain 12/09/2016  . GERD (gastroesophageal reflux disease) 12/09/2016  . CHF (congestive heart failure) (HCC) 12/09/2016  . Schizophrenia (HCC)   . Empyema lung (HCC) 12/28/2015  . Acute kidney injury (HCC)   . Loculated pleural effusion   . Pleural effusion   . CAP (community acquired pneumonia) 12/21/2015  . HTN (hypertension) 12/21/2015  . HLD (hyperlipidemia) 12/21/2015    Past Surgical History:  Procedure Laterality Date  . PLEURAL EFFUSION DRAINAGE Right 12/28/2015   Procedure: DRAINAGE OF RIGHT  PLEURAL EFFUSION;  Surgeon: Loreli Slot, MD;  Location[facility];  Service: Thoracic;  Laterality: Right;  Marland Kitchen VIDEO ASSISTED THORACOSCOPY (VATS)/DECORTICATION Right 12/28/2015   Procedure: RIGHT VIDEO ASSISTED THORACOSCOPY (VATS)/DECORTICATION;  Surgeon: Loreli Slot, MD;  Location: [facility] OR;  Service: Thoracic;  Laterality: Right;       Home Medications    Prior to Admission medications   Medication Sig Start Date End Date Taking? Authorizing Provider  albuterol (PROVENTIL HFA;VENTOLIN HFA) 108 (90 Base) MCG/ACT inhaler  Inhale 1-2 puffs into the lungs every 6 (six) hours as needed for wheezing or shortness of breath.   Yes Historical Provider, MD  amLODipine (NORVASC) 5 MG tablet Take 5 mg by mouth daily.   Yes Historical Provider, MD  aspirin 81 MG tablet Take 81 mg by mouth daily.   Yes Historical Provider, MD  benztropine (COGENTIN) 0.5 MG tablet Take 0.5 mg by mouth 2 (two) times daily.   Yes Historical Provider, MD  carbamazepine (TEGRETOL) 200 MG tablet Take 200-400 mg by mouth 2 (two) times daily. Take 1 tablet by mouth every morning, and 2 tablets at bedtime   Yes Historical Provider, MD  cetirizine (ZYRTEC) 10 MG tablet Take 10 mg by mouth at bedtime.   Yes Historical Provider, MD  cholecalciferol (VITAMIN D) 400 units TABS tablet Take 400 Units by mouth daily.   Yes Historical Provider, MD  docusate sodium (COLACE) 100 MG capsule Take 100 mg by mouth 2 (two) times daily.   Yes Historical Provider, MD  haloperidol (HALDOL) 20 MG tablet Take 20 mg by mouth at bedtime.   Yes Historical Provider, MD  hydrochlorothiazide (MICROZIDE) 12.5 MG capsule Take 12.5 mg by mouth daily.   Yes Historical Provider, MD  pantoprazole (PROTONIX) 40 MG tablet Take 1 tablet (40 mg total) by mouth daily before breakfast. 01/03/16  Yes [name], MD  simvastatin (ZOCOR) 10 MG tablet Take 10 mg by mouth at bedtime.   Yes Historical Provider, MD  Skin Protectants, Misc. (EUCERIN) cream Apply 1 application  topically 2 (two) times daily.   Yes Historical Provider, MD  acetaminophen (TYLENOL) 500 MG tablet Take 500 mg by mouth every 8 (eight) hours as needed for mild pain, moderate pain or headache.    Historical Provider, MD  albuterol (PROVENTIL) (2.5 MG/3ML) 0.083% nebulizer solution Take 3 mLs (2.5 mg total) by nebulization every 4 (four) hours as needed for wheezing or shortness of breath. Patient not taking: Reported on 12/09/2016 01/03/16   Dorothea OgleIskra M Myers, MD  oxyCODONE (OXY IR/ROXICODONE) 5 MG immediate release tablet Take 1-2  tablets (5-10 mg total) by mouth every 4 (four) hours as needed for moderate pain. Patient not taking: Reported on 12/09/2016 01/03/16   Dorothea OgleIskra M Myers, MD    Family History Family History  Problem Relation Age of Onset  . Diabetes Mellitus II Brother     Social History Social History  Substance Use Topics  . Smoking status: Heavy Tobacco Smoker    Packs/day: 0.50    Types: Cigars  . Smokeless tobacco: Not on file  . Alcohol use No     Allergies   Patient has no known allergies.   Review of Systems Review of Systems  Constitutional: Negative for fever.  HENT: Negative for congestion.   Eyes: Negative for visual disturbance.  Respiratory: Negative for shortness of breath.   Cardiovascular: Positive for chest pain.  Gastrointestinal: Positive for abdominal pain.  Genitourinary: Negative for dysuria and hematuria.  Neurological: Negative for headaches.  Hematological: Does not bruise/bleed easily.  Psychiatric/Behavioral: Negative for behavioral problems, confusion and hallucinations.     Physical Exam Updated Vital Signs BP 113/62 (BP Location: Left Arm)   Pulse 87   Temp 98 F (36.7 C) (Oral)   Resp 16   SpO2 98%   Physical Exam  Constitutional: He is oriented to person, place, and time. He appears well-developed and well-nourished. No distress.  HENT:  Head: Normocephalic and atraumatic.  Mouth/Throat: Oropharynx is clear and moist.  Eyes: EOM are normal. Pupils are equal, round, and reactive to light.  Neck: Normal range of motion. Neck supple.  Cardiovascular: Normal rate, regular rhythm and normal heart sounds.   Pulmonary/Chest: Effort normal and breath sounds normal. No respiratory distress. He exhibits no tenderness.  Abdominal: Soft. Bowel sounds are normal. There is no tenderness.  Musculoskeletal: Normal range of motion. He exhibits no edema.  Neurological: He is alert and oriented to person, place, and time. No cranial nerve deficit or sensory  deficit. He exhibits normal muscle tone. Coordination normal.  Skin: Skin is warm.  Nursing note and vitals reviewed.    ED Treatments / Results  Labs (all labs ordered are listed, but only abnormal results are displayed) Labs Reviewed  CBC WITH DIFFERENTIAL/PLATELET - Abnormal; Notable for the following:       Result Value   WBC 16.2 (*)    Neutro Abs 13.8 (*)    All other components within normal limits  COMPREHENSIVE METABOLIC PANEL - Abnormal; Notable for the following:    Creatinine, Ser 1.39 (*)    ALT 15 (*)    GFR calc non Af Amer 52 (*)    All other components within normal limits  CULTURE, BLOOD (ROUTINE X 2)  CULTURE, BLOOD (ROUTINE X 2)  CULTURE, EXPECTORATED SPUTUM-ASSESSMENT  GRAM STAIN  LIPASE, BLOOD  BRAIN NATRIURETIC PEPTIDE  HIV ANTIBODY (ROUTINE TESTING)  STREP PNEUMONIAE URINARY ANTIGEN  CBC  CREATININE, SERUM  HEMOGLOBIN A1C  LEGIONELLA PNEUMOPHILA SEROGP 1 UR AG  TROPONIN I  TROPONIN I  TROPONIN I  I-STAT TROPOININ, ED    EKG  EKG Interpretation  Date/Time:  Sunday December 09 2016 07:35:00 EDT Ventricular Rate:  82 PR Interval:    QRS Duration: 94 QT Interval:  377 QTC Calculation: 441 R Axis:   70 Text Interpretation:  Sinus rhythm Borderline T wave abnormalities ST elevation, consider inferior injury Baseline wander in lead(s) II III aVF No significant change since last tracing Confirmed by Pamila Mendibles  MD, Mykale Gandolfo [id number]) on 12/09/2016 7:43:30 AM       Radiology Dg Chest 2 View  Result Date: 12/09/2016 CLINICAL DATA:  Chest pain and difficulty breathing EXAM: CHEST  2 VIEW COMPARISON:  01/24/2016 FINDINGS: Cardiac shadow is within normal limits. Some scarring is noted in the right mid lung. Mild increased vascular congestion is noted with early interstitial changes consistent with mild CHF. No sizable effusion is noted. Patchy changes in the right lung base are noted consistent with early infiltrate. IMPRESSION: Mild CHF and right basilar  infiltrate. Electronically Signed   By: Alcide Clever M.D.   On: 12/09/2016 08:52    Procedures Procedures (including critical care time)  Medications Ordered in ED Medications  0.9 %  sodium chloride infusion (not administered)  azithromycin (ZITHROMAX) 500 mg in dextrose 5 % 250 mL IVPB (not administered)  acetaminophen (TYLENOL) tablet 500 mg (not administered)  amLODipine (NORVASC) tablet 5 mg (not administered)  aspirin EC tablet 81 mg (not administered)  benztropine (COGENTIN) tablet 0.5 mg (not administered)  loratadine (CLARITIN) tablet 10 mg (not administered)  cholecalciferol (VITAMIN D) tablet 400 Units (not administered)  docusate sodium (COLACE) capsule 100 mg (not administered)  haloperidol (HALDOL) tablet 20 mg (not administered)  hydrochlorothiazide (MICROZIDE) capsule 12.5 mg (not administered)  pantoprazole (PROTONIX) EC tablet 40 mg (not administered)  simvastatin (ZOCOR) tablet 10 mg (not administered)  eucerin cream 1 application (not administered)  heparin injection 5,000 Units (not administered)  sodium chloride flush (NS) 0.9 % injection 3 mL (not administered)  bisacodyl (DULCOLAX) EC tablet 5 mg (not administered)  senna-docusate (Senokot-S) tablet 1 tablet (not administered)  cefTRIAXone (ROCEPHIN) 1 g in dextrose 5 % 50 mL IVPB (not administered)  azithromycin (ZITHROMAX) 500 mg in dextrose 5 % 250 mL IVPB (not administered)  albuterol (PROVENTIL) (2.5 MG/3ML) 0.083% nebulizer solution 2.5 mg (not administered)  carbamazepine (TEGRETOL) tablet 200 mg (not administered)  carbamazepine (TEGRETOL) tablet 400 mg (not administered)  cefTRIAXone (ROCEPHIN) 1 g in dextrose 5 % 50 mL IVPB (1 g Intravenous New Bag/Given 12/09/16 1145)     Initial Impression / Assessment and Plan / ED Course  I have reviewed the triage vital signs and the nursing notes.  Pertinent labs & imaging results that were available during my care of the patient were reviewed by me and  considered in my medical decision making (see chart for details).    Patient presenting with the complaint of chest pain it was left substernal area somewhat in the epigastric area started at 4 this morning resolved at 7:30 by I Truax and aspirin given to him by EMS. Patient is from the [facility].  Patient has not had any recurrent chest pain here. Workup raises concern for right-sided pneumonia does have a leukocytosis. Chest Rales are racing surgery from a mild pulmonary edema. BMP pending first troponin was negative EKG without acute findings. Patient will be admitted by [facility] hospitalist for chest pain rule out as well as workup for the pulmonary edema. For the  pneumonia would be a community-acquired pneumonia patient started here on Rocephin and Zithromax.   Final Clinical Impressions(s) / ED Diagnoses   Final diagnoses:  Precordial pain  Community acquired pneumonia of right lower lobe of lung (HCC)  Acute pulmonary edema (HCC)    New Prescriptions New Prescriptions   No medications on file     Vanetta Mulders, MD 12/09/16 1229

## 2016-12-09 NOTE — H&P (Signed)
[facility] History and Physical  Ian Carter JOA:[id number] DOB: September 13, 1951 DOA: 12/09/2016  Referring physician: ED MD PCP: No PCP Per Patient   Chief Complaint: chest pain  HPI: Ian Carter is a 66 y.o. male that lives in a group home with history of schizophrenia, hypertension, hyperlipidemia, prior VATS 4/17 for empyema, presents to our hospital secondary to acute onset midepigastric pain and chest pain.  He was given ASA 324 mg, and 1 sublingual nitroglycerin.  Pt  still c/o of chest discomfort on palpation, but not at rest. Denies c/f/c/n/v.  Said he "had a hard time coughing due to chest pain."   No family at bedside.      Review of Systems:  Ros limited due to his schizophrenia.    Past Medical History:  Diagnosis Date  . Anxiety   . Dyslipidemia   . Hypertension   . Schizophrenia (HCC)   . Seizures (HCC)    Past Surgical History:  Procedure Laterality Date  . PLEURAL EFFUSION DRAINAGE Right 12/28/2015   Procedure: DRAINAGE OF RIGHT  PLEURAL EFFUSION;  Surgeon: Loreli Slot, MD;  Location: [facility] OR;  Service: Thoracic;  Laterality: Right;  Marland Kitchen VIDEO ASSISTED THORACOSCOPY (VATS)/DECORTICATION Right 12/28/2015   Procedure: RIGHT VIDEO ASSISTED THORACOSCOPY (VATS)/DECORTICATION;  Surgeon: Loreli Slot, MD;  Location: [facility] OR;  Service: Thoracic;  Laterality: Right;   Social History:  reports that he has been smoking Cigars.  He has been smoking about 0.50 packs per day. He does not have any smokeless tobacco history on file. He reports that he does not drink alcohol or use drugs.  No Known Allergies  Family History  Problem Relation Age of Onset  . Diabetes Mellitus II Brother     Prior to Admission medications   Medication Sig Start Date End Date Taking? Authorizing Provider  albuterol (PROVENTIL HFA;VENTOLIN HFA) 108 (90 Base) MCG/ACT inhaler Inhale 1-2 puffs into the lungs every 6 (six) hours as needed for wheezing or shortness of breath.    Yes Historical Provider, MD  amLODipine (NORVASC) 5 MG tablet Take 5 mg by mouth daily.   Yes Historical Provider, MD  aspirin 81 MG tablet Take 81 mg by mouth daily.   Yes Historical Provider, MD  benztropine (COGENTIN) 0.5 MG tablet Take 0.5 mg by mouth 2 (two) times daily.   Yes Historical Provider, MD  carbamazepine (TEGRETOL) 200 MG tablet Take 200-400 mg by mouth 2 (two) times daily. Take 1 tablet by mouth every morning, and 2 tablets at bedtime   Yes Historical Provider, MD  cetirizine (ZYRTEC) 10 MG tablet Take 10 mg by mouth at bedtime.   Yes Historical Provider, MD  cholecalciferol (VITAMIN D) 400 units TABS tablet Take 400 Units by mouth daily.   Yes Historical Provider, MD  docusate sodium (COLACE) 100 MG capsule Take 100 mg by mouth 2 (two) times daily.   Yes Historical Provider, MD  haloperidol (HALDOL) 20 MG tablet Take 20 mg by mouth at bedtime.   Yes Historical Provider, MD  hydrochlorothiazide (MICROZIDE) 12.5 MG capsule Take 12.5 mg by mouth daily.   Yes Historical Provider, MD  pantoprazole (PROTONIX) 40 MG tablet Take 1 tablet (40 mg total) by mouth daily before breakfast. 01/03/16  Yes [name], MD  simvastatin (ZOCOR) 10 MG tablet Take 10 mg by mouth at bedtime.   Yes Historical Provider, MD  Skin Protectants, Misc. (EUCERIN) cream Apply 1 application topically 2 (two) times daily.   Yes Historical Provider, MD  acetaminophen (  TYLENOL) 500 MG tablet Take 500 mg by mouth every 8 (eight) hours as needed for mild pain, moderate pain or headache.    Historical Provider, MD  albuterol (PROVENTIL) (2.5 MG/3ML) 0.083% nebulizer solution Take 3 mLs (2.5 mg total) by nebulization every 4 (four) hours as needed for wheezing or shortness of breath. Patient not taking: Reported on 12/09/2016 01/03/16   Dorothea OgleIskra M Myers, MD  oxyCODONE (OXY IR/ROXICODONE) 5 MG immediate release tablet Take 1-2 tablets (5-10 mg total) by mouth every 4 (four) hours as needed for moderate pain. Patient not  taking: Reported on 12/09/2016 01/03/16   Dorothea OgleIskra M Myers, MD   Physical Exam: Vitals:   12/09/16 0735 12/09/16 0745 12/09/16 1112 12/09/16 1113  BP: 116/77 111/71 113/62 113/62  Pulse: 84 81 (!) 52 87  Resp: 18 17  16   Temp: 98 F (36.7 C)     TempSrc: Oral     SpO2: 100% 98% 96% 98%    Wt Readings from Last 3 Encounters:  01/31/16 94.8 kg (209 lb)  01/03/16 80.8 kg (178 lb 2.1 oz)    General:  Appears calm and comfortable, pleasant, NAD, AAOx3, speech slow, but coherent. Pleasant. Appears euvolemic. Eyes: PERRL, normal lids, irises & conjunctiva ENT: grossly normal hearing, lips & tongue, mmm Neck: no LAD, masses or thyromegaly, no jvd. Cardiovascular: RRR, no m/r/g. No LE edema. Telemetry: SR, no arrhythmias  Respiratory: CTA bilaterally, no w/r/r. Normal respiratory effort.  Mild ttp meg region on palpation. Abdomen: soft, ntnd, +bs Skin: no rash or induration seen on limited exam Musculoskeletal: grossly normal tone BUE/BLE Psychiatric: grossly normal mood and affect, speech fluent and appropriate Neurologic: grossly non-focal.          Labs on Admission:  Basic Metabolic Panel:  Recent Labs Lab 12/09/16 0909  NA 139  K 4.1  CL 104  CO2 26  GLUCOSE 91  BUN 16  CREATININE 1.39*  CALCIUM 9.0   Liver Function Tests:  Recent Labs Lab 12/09/16 0909  AST 17  ALT 15*  ALKPHOS 121  BILITOT 0.5  PROT 6.7  ALBUMIN 3.6    Recent Labs Lab 12/09/16 0909  LIPASE 13   No results for input(s): AMMONIA in the last 168 hours. CBC:  Recent Labs Lab 12/09/16 0909  WBC 16.2*  NEUTROABS 13.8*  HGB 14.8  HCT 43.5  MCV 88.6  PLT 174   Cardiac Enzymes: No results for input(s): CKTOTAL, CKMB, CKMBINDEX, TROPONINI in the last 168 hours.  BNP (last 3 results) No results for input(s): BNP in the last 8760 hours.  ProBNP (last 3 results) No results for input(s): PROBNP in the last 8760 hours.  CBG: No results for input(s): GLUCAP in the last 168  hours.  Radiological Exams on Admission: Dg Chest 2 View  Result Date: 12/09/2016 CLINICAL DATA:  Chest pain and difficulty breathing EXAM: CHEST  2 VIEW COMPARISON:  01/24/2016 FINDINGS: Cardiac shadow is within normal limits. Some scarring is noted in the right mid lung. Mild increased vascular congestion is noted with early interstitial changes consistent with mild CHF. No sizable effusion is noted. Patchy changes in the right lung base are noted consistent with early infiltrate. IMPRESSION: Mild CHF and right basilar infiltrate. Electronically Signed   By: Alcide CleverMark  Lukens M.D.   On: 12/09/2016 08:52    EKG: Independently reviewed.   EKG Interpretation  Date/Time:  Sunday December 09 2016 07:35:00 EDT Ventricular Rate:  82 PR Interval:    QRS Duration: 94 QT  Interval:  377 QTC Calculation: 441 R Axis:   70 Text Interpretation:  Sinus rhythm Borderline T wave abnormalities ST elevation, consider inferior injury Baseline wander in lead(s) II III aVF No significant change since last tracing Confirmed by ZACKOWSKI  MD, SCOTT [phone]) on 12/09/2016 7:43:30 AM        Assessment/Plan Principal Problem:   CAP (community acquired pneumonia) Active Problems:   HTN (hypertension)   HLD (hyperlipidemia)   Schizophrenia (HCC)   Chest pain   GERD (gastroesophageal reflux disease)   CHF (congestive heart failure) (HCC)   1. Right lower lobe infiltrate noted on chest x-ray, w/ leukocytosis, afebrile currently - pt lives in a group home, not snf/nh.  Will empirically treat as community-acquired pneumonia. - obs tels - chk sputum cx, urine leg/strep, fu on blood cxs - emp rocephin/azithromycin  2. Chest pain - ?pleuresis, vs cardiac etiology - serial ces., first CE neg - am ekg  3. Mild chf noted on chest xray as well - no prior hx., euvolemic on exam - bnp pending - chk echo - am ekg  4. gerd Continue ppi  5. htn Controlled, continue asa 81, hctz 12.5 qd, norvasc 5qd,   6. hld -  continue simvastatin  7. Schizophrenia - appears functional - continue haldol 20 qhs, tegretol 200qam and 400mg  qpm,   8. Hx of ckd, but gfr >60 currently.  No c/s at this time.  Code Status: full  (must indicate code status--if unknown or must be presumed, indicate so) DVT Prophylaxis: hep 5k tid Family Communication:  Patient, called brother/George Marita [place] [phone], attempted to leave vm x2; called Pt's case mgr on his ppwk, tried calling Tammy, [phone] ([place] [facility], but the vm was to another person, so did not leave vm),   Disposition Plan: obs tele  Time spent:  Pete Glatter MD., MBA/MHA [facility] Pager [phone]

## 2016-12-09 NOTE — ED Triage Notes (Signed)
Per EMS: pt sts mid sternal CP x 2 hours worse with palpation; pt given 324mg  ASA and 1 SL nitro; [facility]

## 2016-12-10 DIAGNOSIS — I10 Essential (primary) hypertension: Secondary | ICD-10-CM | POA: Diagnosis not present

## 2016-12-10 DIAGNOSIS — K219 Gastro-esophageal reflux disease without esophagitis: Secondary | ICD-10-CM | POA: Diagnosis not present

## 2016-12-10 DIAGNOSIS — F209 Schizophrenia, unspecified: Secondary | ICD-10-CM | POA: Diagnosis not present

## 2016-12-10 DIAGNOSIS — J181 Lobar pneumonia, unspecified organism: Secondary | ICD-10-CM | POA: Diagnosis not present

## 2016-12-10 LAB — BASIC METABOLIC PANEL
ANION GAP: 9 (ref 5–15)
BUN: 17 mg/dL (ref 6–20)
CO2: 27 mmol/L (ref 22–32)
Calcium: 9 mg/dL (ref 8.9–10.3)
Chloride: 103 mmol/L (ref 101–111)
Creatinine, Ser: 1.36 mg/dL — ABNORMAL HIGH (ref 0.61–1.24)
GFR calc Af Amer: >60 mL/min (ref >60)
GFR, EST NON AFRICAN AMERICAN: 53 mL/min — AB (ref >60)
GLUCOSE: 102 mg/dL — AB (ref 65–99)
POTASSIUM: 3.9 mmol/L (ref 3.5–5.1)
Sodium: 139 mmol/L (ref 135–145)

## 2016-12-10 LAB — CBC
HEMATOCRIT: 43.3 % (ref 39.0–52.0)
Hemoglobin: 14.6 g/dL (ref 13.0–17.0)
MCH: 29.6 pg (ref 26.0–34.0)
MCHC: 33.7 g/dL (ref 30.0–36.0)
MCV: 87.7 fL (ref 78.0–100.0)
PLATELETS: 186 10*3/uL (ref 150–400)
RBC: 4.94 MIL/uL (ref 4.22–5.81)
RDW: 13.7 % (ref 11.5–15.5)
WBC: 11.5 10*3/uL — AB (ref 4.0–10.5)

## 2016-12-10 LAB — HEMOGLOBIN A1C
Hgb A1c MFr Bld: 6 % — ABNORMAL HIGH (ref 4.8–5.6)
Mean Plasma Glucose: 126 mg/dL

## 2016-12-10 LAB — TROPONIN I: Troponin I: 0.07 ng/mL (ref <0.03)

## 2016-12-10 LAB — HIV ANTIBODY (ROUTINE TESTING W REFLEX): HIV SCREEN 4TH GENERATION: NONREACTIVE

## 2016-12-10 MED ORDER — LEVOFLOXACIN 750 MG PO TABS: 750.0000 mg | ORAL_TABLET | ORAL | 0 refills | Status: DC

## 2016-12-10 MED ORDER — PNEUMOCOCCAL VAC POLYVALENT 25 MCG/0.5ML IJ INJ: 0.5000 mL | INJECTION | INTRAMUSCULAR | Status: DC

## 2016-12-10 MED ORDER — LEVOFLOXACIN 750 MG PO TABS
750.0000 mg | ORAL_TABLET | ORAL | Status: DC
  Administered 2016-12-10: 750 mg via ORAL
  Filled 2016-12-10: qty 1

## 2016-12-10 NOTE — Progress Notes (Signed)
Pharmacy Antibiotic Note  Ian Carter is a 66 y.o. male admitted on 12/09/2016 with pneumonia.  Pharmacy has been consulted for Levofloxacin PO dosing.  Plan: Levofloxacin 750mg  PO q24h Follow c/s, clinical progression, renal function, length of therapy  Height: 6\' 2"  (188 cm) Weight: 193 lb 12.8 oz (87.9 kg) IBW/kg (Calculated) : 82.2  Temp (24hrs), Avg:98.7 F (37.1 C), Min:98.2 F (36.8 C), Max:99.1 F (37.3 C)   Recent Labs Lab 12/09/16 0909 12/09/16 1230 12/10/16 0616  WBC 16.2* 17.3* 11.5*  CREATININE 1.39* 1.28* 1.36*    Estimated Creatinine Clearance: 63.8 mL/min (by C-G formula based on SCr of 1.36 mg/dL (H)).    No Known Allergies  Antimicrobials this admission: Levofloxacin 3/12 >>   Dose adjustments this admission: n/a  Microbiology results: 3/11 BCx: sent   Thank you for allowing pharmacy to be a part of this patient's care.  Kyree Adriano D. Doris Mcgilvery, PharmD, BCPS Clinical Pharmacist Pager: [phone] 12/10/2016 10:44 AM

## 2016-12-10 NOTE — Evaluation (Signed)
Clinical/Bedside Swallow Evaluation Patient Details  Name: Ian Carter MRN: [id number] Date of Birth: [birth year]  Today's Date: 12/10/2016 Time: SLP Start Time (ACUTE ONLY): 1532 SLP Stop Time (ACUTE ONLY): 1548 SLP Time Calculation (min) (ACUTE ONLY): 16 min  Past Medical History:  Past Medical History:  Diagnosis Date  . Anxiety   . Dyslipidemia   . Hypertension   . Schizophrenia (HCC)   . Seizures (HCC)    Past Surgical History:  Past Surgical History:  Procedure Laterality Date  . PLEURAL EFFUSION DRAINAGE Right 12/28/2015   Procedure: DRAINAGE OF RIGHT  PLEURAL EFFUSION;  Surgeon: Loreli SlotSteven C Hendrickson, MD;  [facility];  Service: Thoracic;  Laterality: Right;  Marland Kitchen. VIDEO ASSISTED THORACOSCOPY (VATS)/DECORTICATION Right 12/28/2015   Procedure: RIGHT VIDEO ASSISTED THORACOSCOPY (VATS)/DECORTICATION;  Surgeon: Loreli SlotSteven C Hendrickson, MD;  [facility];  Service: Thoracic;  Laterality: Right;   HPI:      Assessment / Plan / Recommendation Clinical Impression  Patient presents with what appears to be a normal oropharyngeal swallow. No overt indication of aspiration noted. Patient is a relatively poor historian regarding PMH with vague descriptions of swallowing difficulty in the past. No SLP f/u indicated however agree with MD that is PNA (particularly right sided) becomes recurrent in nature, consider MBS.  SLP Visit Diagnosis: Dysphagia, unspecified (R13.10)    Aspiration Risk  No limitations    Diet Recommendation Regular;Thin liquid   Liquid Administration via: Cup;Straw Medication Administration: Whole meds with liquid Supervision: Patient able to self feed Compensations: Slow rate;Small sips/bites Postural Changes: Seated upright at 90 degrees    Other  Recommendations Oral Care Recommendations: Oral care BID   Follow up Recommendations None        Swallow Study   General Type of Study: Bedside Swallow Evaluation Previous Swallow Assessment: none Diet  Prior to this Study: Regular;Thin liquids Temperature Spikes Noted: No Respiratory Status: Room air History of Recent Intubation: No Behavior/Cognition: Alert;Cooperative;Pleasant mood Oral Cavity Assessment: Within Functional Limits Oral Care Completed by SLP: No Oral Cavity - Dentition: Adequate natural dentition Vision: Functional for self-feeding Self-Feeding Abilities: Able to feed self Patient Positioning: Upright in chair Baseline Vocal Quality: Normal Volitional Cough: Strong Volitional Swallow: Able to elicit    Oral/Motor/Sensory Function Overall Oral Motor/Sensory Function: Within functional limits   Ice Chips Ice chips: Not tested   Thin Liquid Thin Liquid: Within functional limits Presentation: Cup;Self Fed;Straw    Nectar Thick Nectar Thick Liquid: Not tested   Honey Thick Honey Thick Liquid: Not tested   Puree Puree: Not tested   Solid   GO   Solid: Within functional limits Presentation: Self Fed    Functional Assessment Tool Used: skilled clinical judgement Functional Limitations: Swallowing Swallow Current Status (W0981(G8996): 0 percent impaired, limited or restricted Swallow Goal Status ([id number](G8997): 0 percent impaired, limited or restricted Swallow Discharge Status ([id number](G8998): 0 percent impaired, limited or restricted  Ferdinand LangoLeah Cosimo Schertzer MA, CCC-SLP [phone]  Rache Klimaszewski Meryl 12/10/2016,3:48 PM

## 2016-12-10 NOTE — Discharge Summary (Signed)
Physician Discharge Summary  Ian Carter WGN:[id number] DOB: Apr 10, 1951 DOA: 12/09/2016  PCP: No PCP Per Patient  Admit date: 12/09/2016 Discharge date: 12/10/2016   Recommendations for Outpatient Follow-Up:   1. SLP eval if recurrent Right sided PNA   Discharge Diagnosis:   Principal Problem:   CAP (community acquired pneumonia) Active Problems:   HTN (hypertension)   HLD (hyperlipidemia)   Schizophrenia (HCC)   Chest pain   GERD (gastroesophageal reflux disease)   CHF (congestive heart failure) (HCC)   Discharge disposition:  Group home  Discharge Condition: Improved.  Diet recommendation: Low sodium, heart healthy  Wound care: None.   History of Present Illness:   Ian Carter is a 66 y.o. male that lives in a group home with history of schizophrenia, hypertension, hyperlipidemia, prior VATS 4/17 for empyema, presents to our hospital secondary to acute onset midepigastric pain and chest pain.  He was given ASA 324 mg, and 1 sublingual nitroglycerin.  Pt  still c/o of chest discomfort on palpation, but not at rest. Denies c/f/c/n/v.  Said he "had a hard time coughing due to chest pain."   No family at bedside.     Hospital Course by Problem:   Right lower lobe infiltrate noted on chest x-ray, w/ leukocytosis, afebrile currently - pt lives in a group home, not snf/nh.  Will empirically treat as community-acquired pneumonia. - levaquin x 7 days -SLP eval if recurrent Right sided PNA  Chest pain - resolved -pleuretic -echo: Left ventricle: The cavity size was normal. Systolic function was   normal. The estimated ejection fraction was in the range of 60%   to 65%. Wall motion was normal; there were no regional wall   motion abnormalities. Doppler parameters are consistent with   abnormal left ventricular relaxation (grade 1 diastolic   dysfunction). Moderate concentric and severe focal basal septal   hypertrophy. Right ventricle: Systolic function was  mildly reduced.  gerd Continue ppi  htn Controlled, continue asa 81, hctz 12.5 qd, norvasc 5qd,   hld - continue simvastatin   Schizophrenia - appears functional - continue haldol 20 qhs, tegretol 200qam and 400mg  qpm,   Hx of ckd -stable -outpatient follow up     Medical Consultants:    None.   Discharge Exam:   Vitals:   12/10/16 0606 12/10/16 0849  BP: 109/74 123/75  Pulse: 92 87  Resp: 16 17  Temp: 98.8 F (37.1 C) 98.2 F (36.8 C)   Vitals:   12/09/16 1336 12/09/16 2119 12/10/16 0606 12/10/16 0849  BP: 112/67 114/71 109/74 123/75  Pulse: 88 91 92 87  Resp: 18 18 16 17   Temp: 98.8 F (37.1 C) 99.1 F (37.3 C) 98.8 F (37.1 C) 98.2 F (36.8 C)  TempSrc: Oral Oral Oral Oral  SpO2: 98% 97% 97% 97%  Weight:  87.9 kg (193 lb 12.8 oz)    Height:        Gen:  NAD    The results of significant diagnostics from this hospitalization (including imaging, microbiology, ancillary and laboratory) are listed below for reference.     Procedures and Diagnostic Studies:   Dg Chest 2 View  Result Date: 12/09/2016 CLINICAL DATA:  Chest pain and difficulty breathing EXAM: CHEST  2 VIEW COMPARISON:  01/24/2016 FINDINGS: Cardiac shadow is within normal limits. Some scarring is noted in the right mid lung. Mild increased vascular congestion is noted with early interstitial changes consistent with mild CHF. No sizable effusion is noted. Patchy changes in the  right lung base are noted consistent with early infiltrate. IMPRESSION: Mild CHF and right basilar infiltrate. Electronically Signed   By: Alcide CleverMark  Lukens M.D.   On: 12/09/2016 08:52     Labs:   Basic Metabolic Panel:  Recent Labs Lab 12/09/16 0909 12/09/16 1230 12/10/16 0616  NA 139  --  139  K 4.1  --  3.9  CL 104  --  103  CO2 26  --  27  GLUCOSE 91  --  102*  BUN 16  --  17  CREATININE 1.39* 1.28* 1.36*  CALCIUM 9.0  --  9.0   GFR Estimated Creatinine Clearance: 63.8 mL/min (by C-G formula  based on SCr of 1.36 mg/dL (H)). Liver Function Tests:  Recent Labs Lab 12/09/16 0909  AST 17  ALT 15*  ALKPHOS 121  BILITOT 0.5  PROT 6.7  ALBUMIN 3.6    Recent Labs Lab 12/09/16 0909  LIPASE 13   No results for input(s): AMMONIA in the last 168 hours. Coagulation profile No results for input(s): INR, PROTIME in the last 168 hours.  CBC:  Recent Labs Lab 12/09/16 0909 12/09/16 1230 12/10/16 0616  WBC 16.2* 17.3* 11.5*  NEUTROABS 13.8*  --   --   HGB 14.8 15.0 14.6  HCT 43.5 44.1 43.3  MCV 88.6 88.9 87.7  PLT 174 227 186   Cardiac Enzymes:  Recent Labs Lab 12/09/16 1230 12/09/16 1747 12/09/16 2345  TROPONINI 0.07* <0.03 <0.03   BNP: Invalid input(s): POCBNP CBG: No results for input(s): GLUCAP in the last 168 hours. D-Dimer No results for input(s): DDIMER in the last 72 hours. Hgb A1c  Recent Labs  12/09/16 1230  HGBA1C 6.0*   Lipid Profile No results for input(s): CHOL, HDL, LDLCALC, TRIG, CHOLHDL, LDLDIRECT in the last 72 hours. Thyroid function studies No results for input(s): TSH, T4TOTAL, T3FREE, THYROIDAB in the last 72 hours.  Invalid input(s): FREET3 Anemia work up No results for input(s): VITAMINB12, FOLATE, FERRITIN, TIBC, IRON, RETICCTPCT in the last 72 hours. Microbiology No results found for this or any previous visit (from the past 240 hour(s)).   Discharge Instructions:   Discharge Instructions    Diet - low sodium heart healthy    Complete by:  As directed    Increase activity slowly    Complete by:  As directed      Allergies as of 12/10/2016   No Known Allergies     Medication List    STOP taking these medications   oxyCODONE 5 MG immediate release tablet Commonly known as:  Oxy IR/ROXICODONE     TAKE these medications   acetaminophen 500 MG tablet Commonly known as:  TYLENOL Take 500 mg by mouth every 8 (eight) hours as needed for mild pain, moderate pain or headache.   albuterol 108 (90 Base) MCG/ACT  inhaler Commonly known as:  PROVENTIL HFA;VENTOLIN HFA Inhale 1-2 puffs into the lungs every 6 (six) hours as needed for wheezing or shortness of breath. What changed:  Another medication with the same name was removed. Continue taking this medication, and follow the directions you see here.   amLODipine 5 MG tablet Commonly known as:  NORVASC Take 5 mg by mouth daily.   aspirin 81 MG tablet Take 81 mg by mouth daily.   benztropine 0.5 MG tablet Commonly known as:  COGENTIN Take 0.5 mg by mouth 2 (two) times daily.   carbamazepine 200 MG tablet Commonly known as:  TEGRETOL Take 200-400 mg by mouth 2 (  two) times daily. Take 1 tablet by mouth every morning, and 2 tablets at bedtime   cetirizine 10 MG tablet Commonly known as:  ZYRTEC Take 10 mg by mouth at bedtime.   cholecalciferol 400 units Tabs tablet Commonly known as:  VITAMIN D Take 400 Units by mouth daily.   docusate sodium 100 MG capsule Commonly known as:  COLACE Take 100 mg by mouth 2 (two) times daily.   eucerin cream Apply 1 application topically 2 (two) times daily.   haloperidol 20 MG tablet Commonly known as:  HALDOL Take 20 mg by mouth at bedtime.   hydrochlorothiazide 12.5 MG capsule Commonly known as:  MICROZIDE Take 12.5 mg by mouth daily.   levofloxacin 750 MG tablet Commonly known as:  LEVAQUIN Take 1 tablet (750 mg total) by mouth daily.   pantoprazole 40 MG tablet Commonly known as:  PROTONIX Take 1 tablet (40 mg total) by mouth daily before breakfast.   simvastatin 10 MG tablet Commonly known as:  ZOCOR Take 10 mg by mouth at bedtime.      Follow-up Information    PCP in 1 week Follow up.            Time coordinating discharge: 35 min  Signed:  Noralyn Karim U Shalan Neault   [facility] 12/10/2016, 1:43 PM

## 2016-12-10 NOTE — Clinical Social Work Note (Signed)
Clinical Social Work Assessment  Patient Details  Name: Ian Carter MRN: [id number] Date of Birth: Feb 27, 1951  Date of referral:  12/09/16               Reason for consult:  Facility Placement                Permission sought to share information with:  Family Supports Permission granted to share information::  Yes, Verbal Permission Granted  Name::     Ian DryDavid Carter  Agency::     Relationship::  Son  SolicitorContact Information:  [phone] (mobile)  Housing/Transportation Living arrangements for the past 2 months:  Single Family Home Source of Information:  Adult Children Patient Interpreter Needed:  None Criminal Activity/Legal Involvement Pertinent to Current Situation/Hospitalization:  No - Comment as needed Significant Relationships:  Spouse, Adult Children Lives with:  Spouse Do you feel safe going back to the place where you live?  No (Son in agreement with ST rehab for patient) Need for family participation in patient care:  No (Coment)  Care giving concerns: None cited by patient or his brother.   Social Worker assessment / plan:  CSW talked with patient at the bedside and brother by phone regarding today's discharge. Ian Carter was sitting up in the chair, dressed and ready to return to [facility]. Patient greeted CSW warmly, and was friendly, however when asked how long he had been at [facility], patient's response was that he has been at facility awhile.  When asked, patient responded that he has never been married and has no children. With the patient's permission, his brother Ian Carter was  contacted by phone while in room with patient and informed him of discharge. When asked how long patient has been with [facility], brother responded that his brother has been there "a lot of years".   Employment status:  Retired Database administratornsurance information:  Managed Medicare [facility]([employer]) PT Recommendations:  Not assessed at this time Information / Referral to community resources:  Other (Comment  Required) (Patient from adult care home - [facility])  Patient/Family's Response to care:  No concerns expressed regarding care during hospitalization.  Patient/Family's Understanding of and Emotional Response to Diagnosis, Current Treatment, and Prognosis: Not discussed.  Emotional Assessment Appearance:  Appears older than stated age Attitude/Demeanor/Rapport:  Other (Appropriate) Affect (typically observed):  Pleasant, Appropriate, Other (Friendly) Orientation:  Oriented to Self, Oriented to Place, Oriented to  Time, Oriented to Situation Alcohol / Substance use:  Tobacco Use, Alcohol Use, Illicit Drugs (Patient reports that he smokes and does not drink or use illicit drugs) Psych involvement (Current and /or in the community):  No (Comment)  Discharge Needs  Concerns to be addressed:  Discharge Planning Concerns Readmission within the last 30 days:  No Current discharge risk:  None Barriers to Discharge:  No Barriers Identified   Ian Carter, Ian Carter Bradley, LCSW 12/10/2016, 4:02 PM

## 2016-12-10 NOTE — Clinical Social Work Note (Signed)
[name] is being discharged today and is returning [facility]. He will be transported by ambulance. Patient's brother, Ian Carter contacted by phone [phone]) and advised of discharge. CS[facility] with [facility] at [facility] and informed him of discharge and discharge clinicals transmitted to facility.  Ian Carter, MSW, LCSW Licensed Clinical Social Worker Clinical Social Work Department [facility] [phone]

## 2016-12-14 LAB — CULTURE, BLOOD (ROUTINE X 2)
CULTURE: NO GROWTH
Culture: NO GROWTH

## 2016-12-27 ENCOUNTER — Encounter: Payer: Self-pay | Admitting: Physician Assistant

## 2017-01-08 ENCOUNTER — Ambulatory Visit: Payer: Medicare Other | Admitting: Physician Assistant

## 2017-01-15 ENCOUNTER — Ambulatory Visit: Payer: Medicare Other | Admitting: Nurse Practitioner

## 2017-01-22 ENCOUNTER — Ambulatory Visit: Payer: Medicare Other | Admitting: Nurse Practitioner

## 2017-02-22 ENCOUNTER — Ambulatory Visit (INDEPENDENT_AMBULATORY_CARE_PROVIDER_SITE_OTHER): Payer: Medicare Other | Admitting: Nurse Practitioner

## 2017-02-22 ENCOUNTER — Encounter: Payer: Self-pay | Admitting: Nurse Practitioner

## 2017-02-22 VITALS — BP 124/72 | HR 84 | Ht 74.0 in | Wt 200.4 lb

## 2017-02-22 DIAGNOSIS — K8080 Other cholelithiasis without obstruction: Secondary | ICD-10-CM | POA: Diagnosis not present

## 2017-02-22 NOTE — Patient Instructions (Signed)
If you are age 65 or older, your body mass index should be between 23-30. Your Body mass index is 25.73 kg/m. If this is out of the aforementioned range listed, please consider follow up with your Primary Care Provider.  If you are age 65 or younger, your body mass index should be between 19-25. Your Body mass index is 25.73 kg/m. If this is out of the aformentioned range listed, please consider follow up with your Primary Care Provider.   We will review ultrasound. If surgical evaluation is needed, we will refer you.  Thank you for choosing me and [facility].   Willette ClusterPaula Guenther, NP

## 2017-02-25 NOTE — Progress Notes (Signed)
HPI:  Patient is a 66 year old male who lives at [facility]. He has a history of schizophrenia, seizures and emphysema. He is referred by Reymundo Poll , MD for gallstones and elevated LFTs. Labs from late May pertinent for minimally elevated alk pho of 128, remaining LFTs normal. CBC normal. Abdominal u/s in Feb reveaied a 1/3 cm stone lodged in the neck of the gallbladder. No evidence for cholecystitis. Normal appearing liver. I have test results but no visit note from [employer] so I am not sure why the ultrasound was done. Patient denies any recent abdominal pain . No vomiting or weight loss. He has "low energy" and discomfort where some calluses were removed but no other complaints. His appetite is okay. BMs okay. No rectal bleeding. Of note, LFTs in March 2018 were normal.     Past Medical History:  Diagnosis Date  . Anxiety   . Dyslipidemia   . Hypertension   . Schizophrenia ([place])   . Seizures ([place])      Past Surgical History:  Procedure Laterality Date  . PLEURAL EFFUSION DRAINAGE Right 12/28/2015   Procedure: DRAINAGE OF RIGHT  PLEURAL EFFUSION;  Surgeon: Melrose Nakayama, MD;  Location: [facility];  Service: Thoracic;  Laterality: Right;  Marland Kitchen VIDEO ASSISTED THORACOSCOPY (VATS)/DECORTICATION Right 12/28/2015   Procedure: RIGHT VIDEO ASSISTED THORACOSCOPY (VATS)/DECORTICATION;  Surgeon: Melrose Nakayama, MD;  Location: [facility];  Service: Thoracic;  Laterality: Right;   Family History  Problem Relation Age of Onset  . Diabetes Mellitus II Brother    Social History  Substance Use Topics  . Smoking status: Heavy Tobacco Smoker    Packs/day: 0.50    Types: Cigars  . Smokeless tobacco: Never Used  . Alcohol use No   Current Outpatient Prescriptions  Medication Sig Dispense Refill  . acetaminophen (TYLENOL) 500 MG tablet Take 500 mg by mouth every 8 (eight) hours as needed for mild pain, moderate pain or headache.    . albuterol  (PROVENTIL HFA;VENTOLIN HFA) 108 (90 Base) MCG/ACT inhaler Inhale 1-2 puffs into the lungs every 6 (six) hours as needed for wheezing or shortness of breath.    Marland Kitchen amLODipine (NORVASC) 5 MG tablet Take 5 mg by mouth daily.    Marland Kitchen aspirin 81 MG tablet Take 81 mg by mouth daily.    . benztropine (COGENTIN) 0.5 MG tablet Take 0.5 mg by mouth 2 (two) times daily.    . carbamazepine (TEGRETOL) 200 MG tablet Take 200-400 mg by mouth 2 (two) times daily. Take 1 tablet by mouth every morning, and 2 tablets at bedtime    . cetirizine (ZYRTEC) 10 MG tablet Take 10 mg by mouth at bedtime.    . cholecalciferol (VITAMIN D) 400 units TABS tablet Take 400 Units by mouth daily.    Marland Kitchen docusate sodium (COLACE) 100 MG capsule Take 100 mg by mouth 2 (two) times daily.    . haloperidol (HALDOL) 20 MG tablet Take 20 mg by mouth at bedtime.    . hydrochlorothiazide (MICROZIDE) 12.5 MG capsule Take 12.5 mg by mouth daily.    Marland Kitchen ketoconazole (NIZORAL) 2 % cream Apply 1 application topically daily.    . pantoprazole (PROTONIX) 40 MG tablet Take 1 tablet (40 mg total) by mouth daily before breakfast. (Patient taking differently: Take 20 mg by mouth daily before breakfast. ) 30 tablet 0  . simvastatin (ZOCOR) 10 MG tablet Take 5 mg by mouth at bedtime.     Marland Kitchen  Skin Protectants, Misc. (EUCERIN) cream Apply 1 application topically 2 (two) times daily.     No current facility-administered medications for this visit.    No Known Allergies   Review of Systems: Positive for shortness of breath at times. All other systems reviewed and negative except where noted in HPI.    Physical Exam: BP 124/72 (BP Location: Left Arm, Patient Position: Sitting, Cuff Size: Normal)   Pulse 84   Ht 6' 2"  (1.88 m)   Wt 200 lb 6.4 oz (90.9 kg)   SpO2 96%   BMI 25.73 kg/m  Constitutional:  Well-developed, black male in no acute distress. Psychiatric: Normal mood and affect. Behavior is normal. EENT: . Conjunctivae are normal. No scleral  icterus. Neck supple.  Cardiovascular: Normal rate, regular rhythm.  Pulmonary/chest: Effort normal and breath sounds normal. No wheezing, rales or rhonchi. Abdominal: Soft, nondistended, nontender. Bowel sounds active throughout. There are no masses palpable. No hepatomegaly. Extremities: no edema Lymphadenopathy: No cervical adenopathy noted. Neurological: Alert and oriented to person place and time. Skin: Skin is warm and dry. No rashes noted.   ASSESSMENT AND PLAN:  1. Pleasant 66 yo male referred for gallstones and elevated LFTs.  U/S in Feb reveals a 1.3 cm stone lodged in the neck of the gallbladder. LFTs in late May are normal except for minimally elevated alk phos of 128. Patient denies any recent or current abdominal pain. He has no GI complaints.  -No evidence for cholecystitis on Feb u/s. Given lack of symptoms I don't know that there is anything to be done at this point. If patient does become symptomatic in the future he will need surgical evaluation.   2. Schizophrenia / hx of seizures  3. Colonoscopy. Apparently had a colonoscopy with [facility] ~ 4 year ago.   Tye Savoy, NP  02/25/2017, 11:25 PM  Cc; Reymundo Poll, MD

## 2017-02-26 ENCOUNTER — Telehealth: Payer: Self-pay

## 2017-02-26 NOTE — Progress Notes (Signed)
I agree with the above note, plan.  If he has symptoms suggestive of biliary disease he should be referred to general surgery.

## 2017-02-26 NOTE — Telephone Encounter (Signed)
-----   Message from Meredith PelPaula M Guenther, NP sent at 02/26/2017 12:25 PM EDT ----- Waynetta SandyBeth, please call the [facility]. Please speak with the nurse and let her know that I saw patient in the office. He has no abdominal pain. No need to do anything about the gallstones unless he develops abdominal pain. At that point he needs a surgical evaluation. Thanks. Please convert to a phone note.

## 2017-02-26 NOTE — Telephone Encounter (Signed)
Spoke with his nurse Lavonne. She is advised of the plan. She will contact us for the referral if his abdominal pain comes back.

## 2017-08-29 ENCOUNTER — Other Ambulatory Visit: Payer: Self-pay | Admitting: Nurse Practitioner

## 2017-09-02 ENCOUNTER — Other Ambulatory Visit: Payer: Self-pay | Admitting: Nurse Practitioner

## 2017-09-02 DIAGNOSIS — Z720 Tobacco use: Secondary | ICD-10-CM

## 2017-09-04 IMAGING — CR DG CHEST 1V PORT
1 series · 1 of 1 positions shown · non-contrast
Comparison: 12/31/2015 and prior exams

CLINICAL DATA: Empyema

EXAM:
PORTABLE CHEST 1 VIEW

[AP]
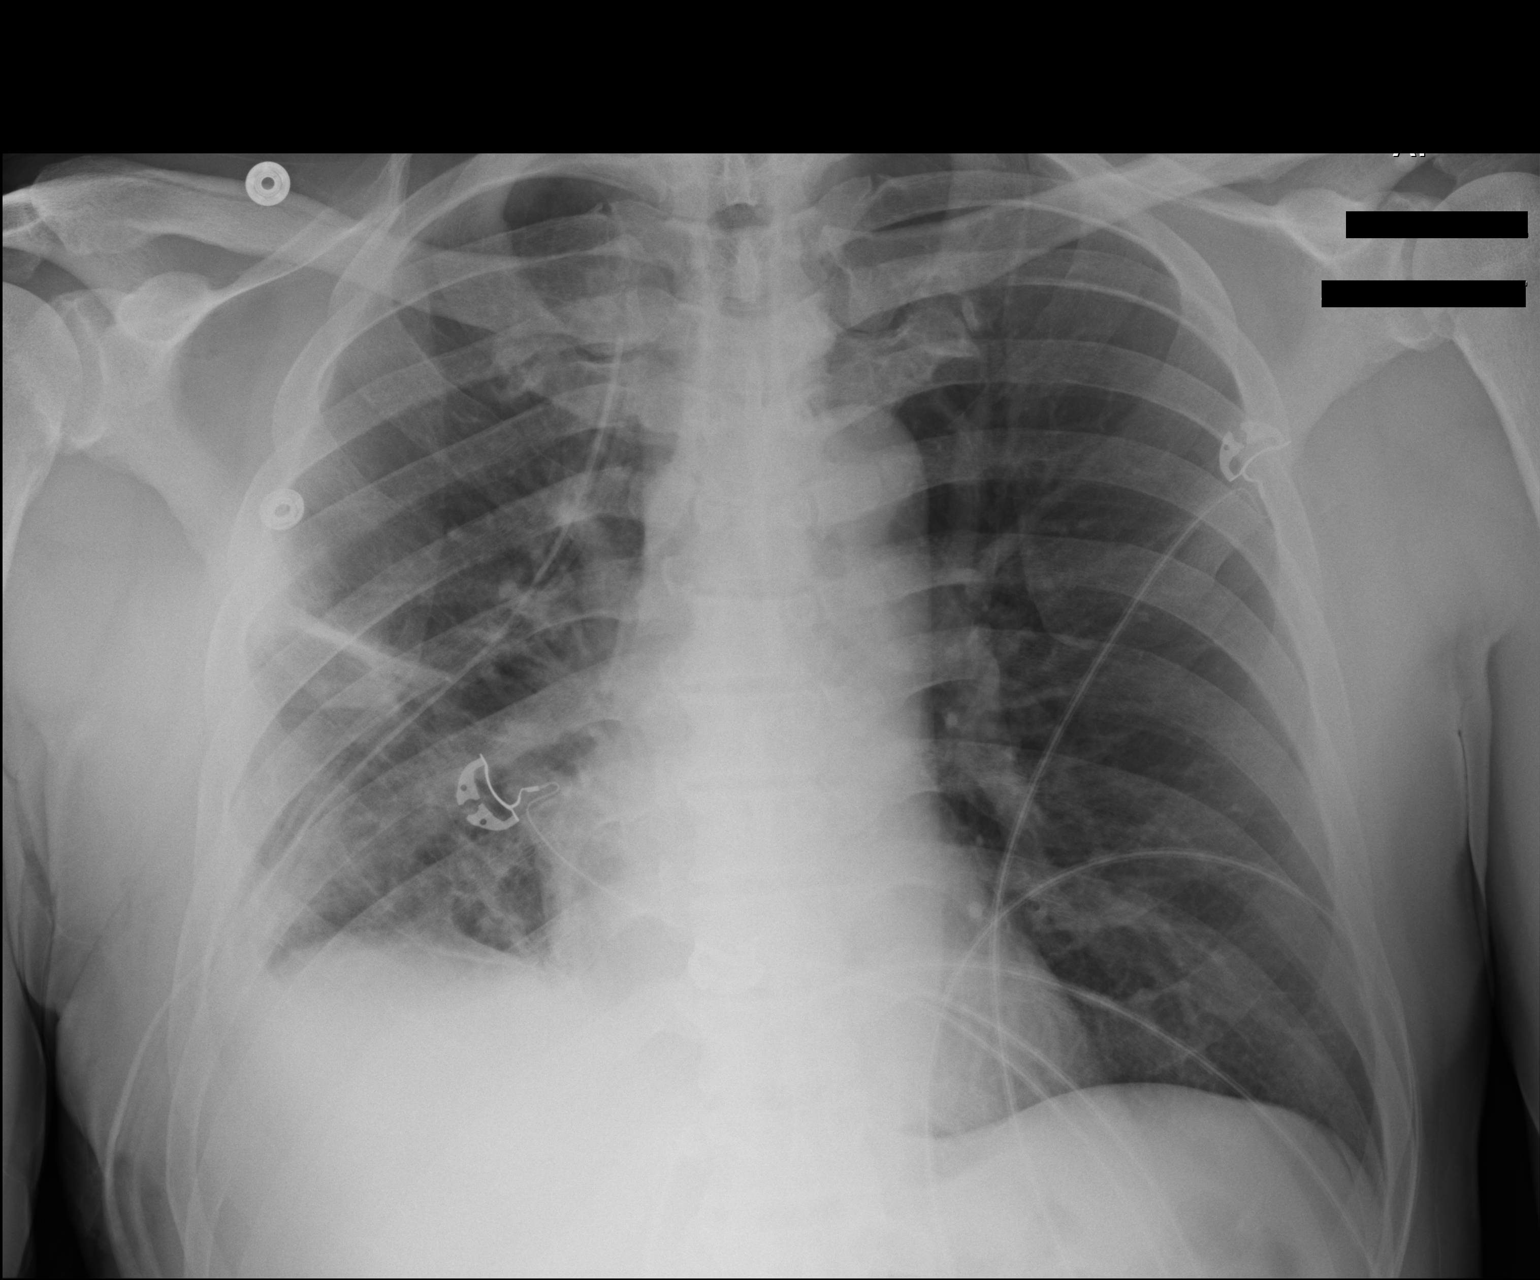

[1 of 1 positions shown; findings below may reference images not displayed]

FINDINGS: A right thoracostomy tube is unchanged. There is no evidence of
pneumothorax.

Right mid and lower lung atelectasis/airspace disease again noted.

The left lung is clear.

Cardiomediastinal silhouette is unchanged.
IMPRESSION: Unchanged appearance of the chest with right thoracostomy tube and
continued right mid and lower lung atelectasis/ airspace disease. No
evidence of pneumothorax.

## 2017-09-05 IMAGING — CR DG CHEST 2V
2 series · 2 of 2 positions shown · non-contrast
Comparison: 01/01/2016

CLINICAL DATA: Followup empyema

EXAM:
CHEST  2 VIEW

[chest pa]
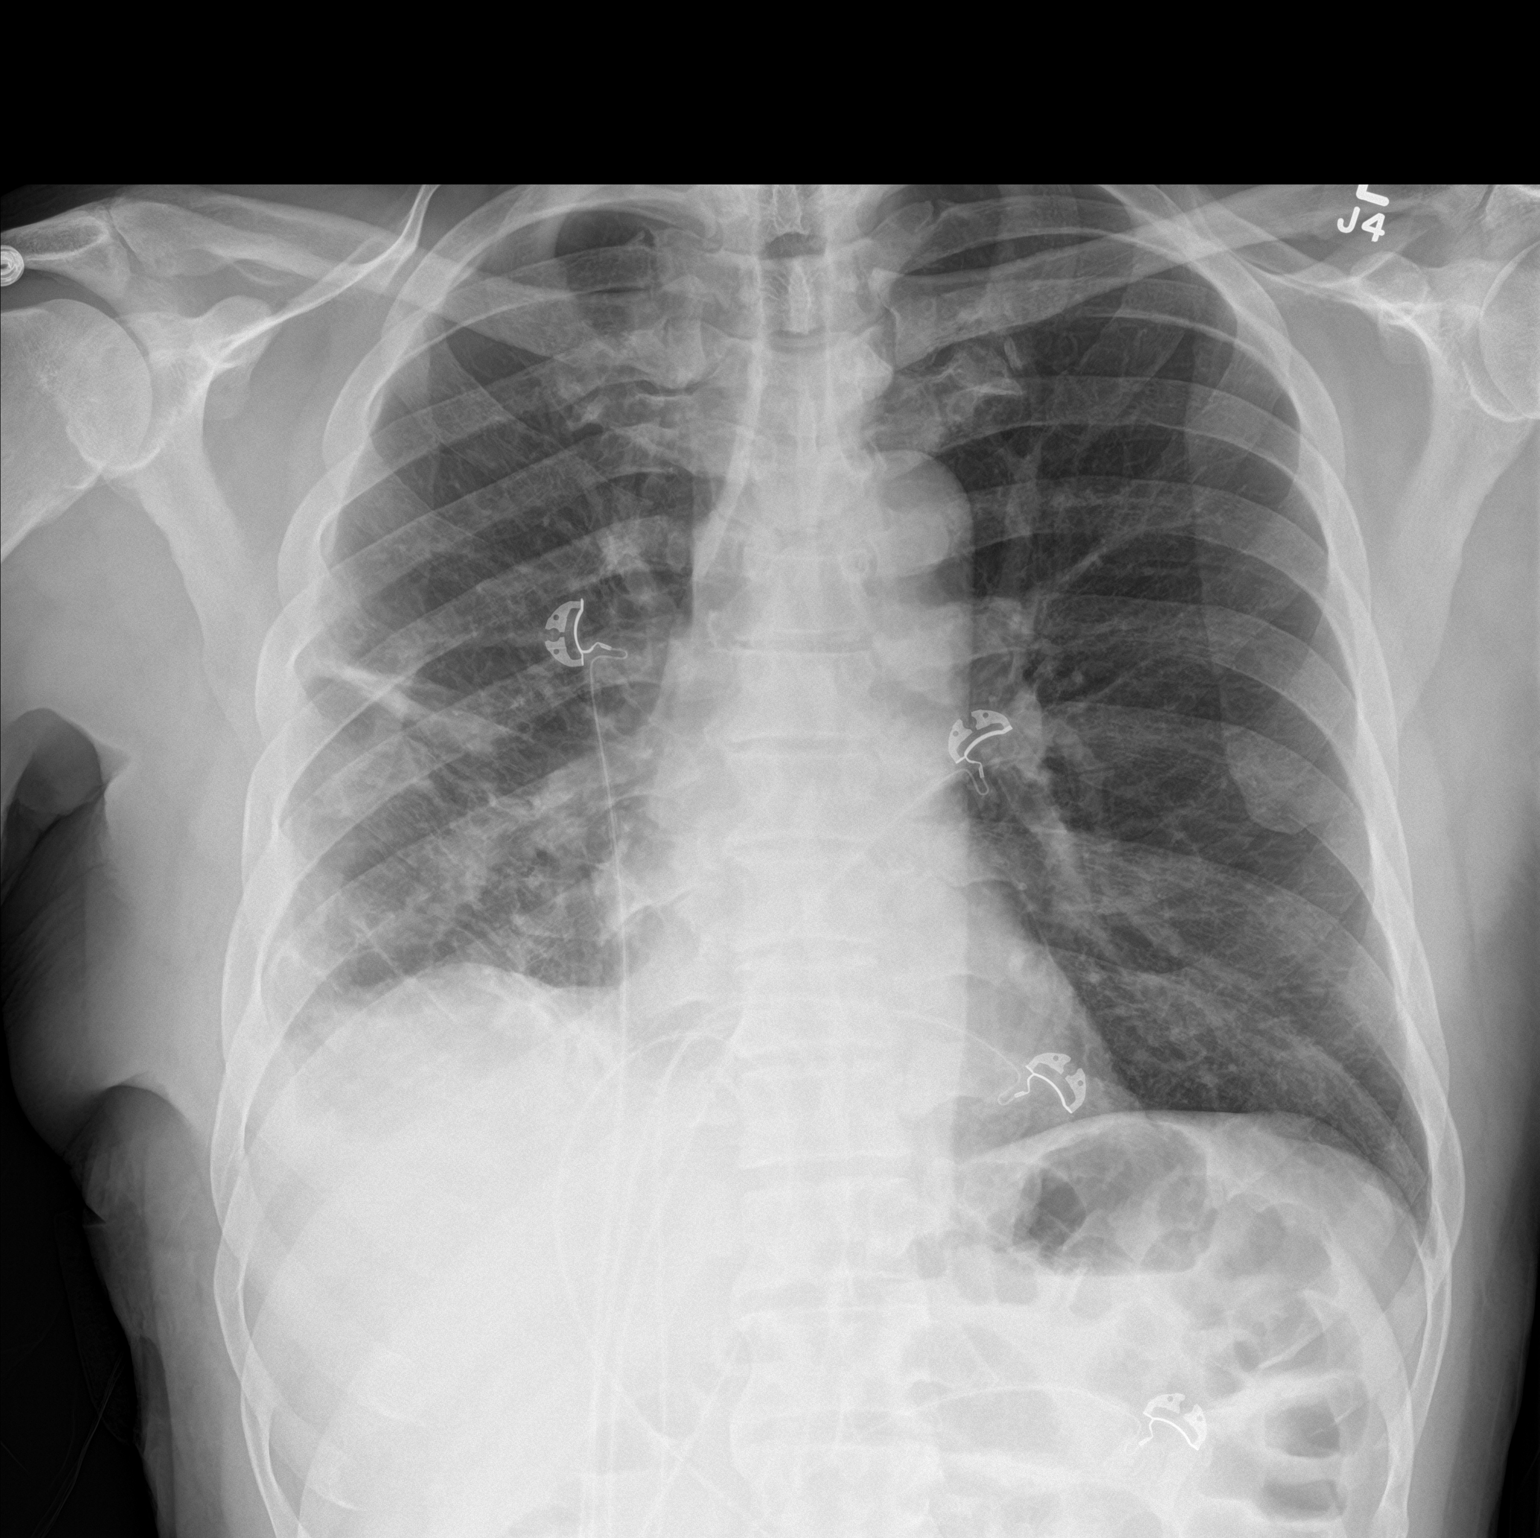

[chest lat]
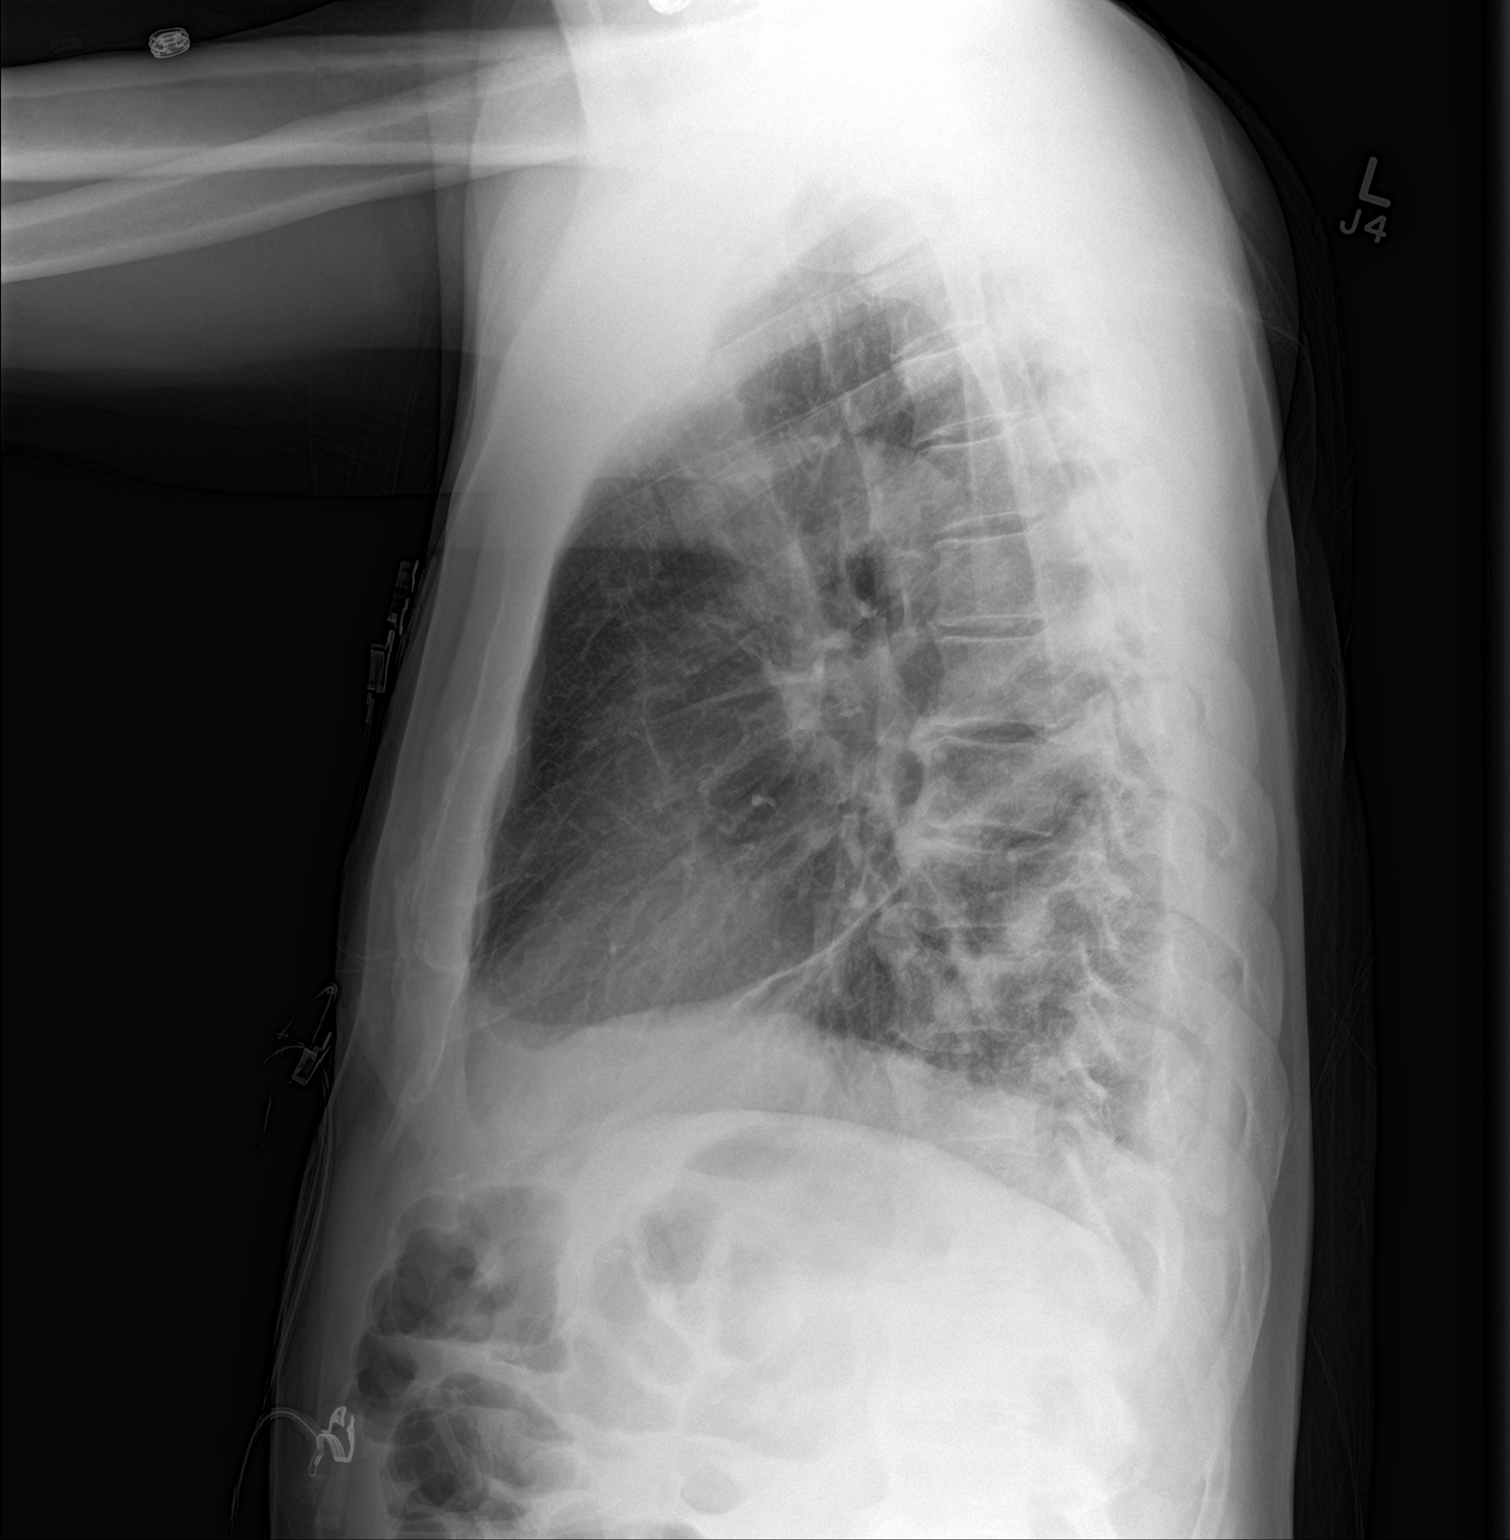

[2 of 2 positions shown; findings below may reference images not displayed]

FINDINGS: The heart size and the vascular pattern are normal. The left lung is
clear. On the right there is miles thickening of the inferior
lateral pleural with blunting of the costophrenic angle. Mild
parenchymal opacity likely reflects atelectasis.
IMPRESSION: Stable mild pleural parenchymal opacity laterally over the right
lower lobe

## 2017-10-08 ENCOUNTER — Ambulatory Visit
Admission: RE | Admit: 2017-10-08 | Discharge: 2017-10-08 | Disposition: A | Discharge status: Home / Self Care | Payer: Medicare Other | Source: Ambulatory Visit | Attending: Nurse Practitioner | Admitting: Nurse Practitioner

## 2017-10-08 DIAGNOSIS — Z720 Tobacco use: Secondary | ICD-10-CM

## 2019-07-14 ENCOUNTER — Other Ambulatory Visit: Payer: Self-pay

## 2019-07-14 ENCOUNTER — Encounter (HOSPITAL_COMMUNITY): Payer: Self-pay

## 2019-07-14 ENCOUNTER — Emergency Department (HOSPITAL_COMMUNITY)
Admission: EM | Admit: 2019-07-14 | Discharge: 2019-07-14 | Disposition: A | Discharge status: Home / Self Care | Payer: Medicare Other | Attending: Emergency Medicine | Admitting: Emergency Medicine

## 2019-07-14 DIAGNOSIS — M549 Dorsalgia, unspecified: Secondary | ICD-10-CM

## 2019-07-14 DIAGNOSIS — I10 Essential (primary) hypertension: Secondary | ICD-10-CM | POA: Diagnosis not present

## 2019-07-14 DIAGNOSIS — Z79899 Other long term (current) drug therapy: Secondary | ICD-10-CM | POA: Insufficient documentation

## 2019-07-14 DIAGNOSIS — I110 Hypertensive heart disease with heart failure: Secondary | ICD-10-CM | POA: Insufficient documentation

## 2019-07-14 DIAGNOSIS — Z7982 Long term (current) use of aspirin: Secondary | ICD-10-CM | POA: Insufficient documentation

## 2019-07-14 DIAGNOSIS — F17290 Nicotine dependence, other tobacco product, uncomplicated: Secondary | ICD-10-CM | POA: Insufficient documentation

## 2019-07-14 DIAGNOSIS — M545 Low back pain: Secondary | ICD-10-CM | POA: Diagnosis not present

## 2019-07-14 DIAGNOSIS — I509 Heart failure, unspecified: Secondary | ICD-10-CM | POA: Diagnosis not present

## 2019-07-14 MED ORDER — IBUPROFEN 200 MG PO TABS
400.0000 mg | ORAL_TABLET | Freq: Once | ORAL | Status: AC
  Administered 2019-07-14: 06:00:00 400 mg via ORAL
  Filled 2019-07-14: qty 2

## 2019-07-14 MED ORDER — CYCLOBENZAPRINE HCL 10 MG PO TABS
5.0000 mg | ORAL_TABLET | Freq: Once | ORAL | Status: AC
  Administered 2019-07-14: 5 mg via ORAL
  Filled 2019-07-14: qty 1

## 2019-07-14 MED ORDER — CYCLOBENZAPRINE HCL 5 MG PO TABS: 5.0000 mg | ORAL_TABLET | Freq: Two times a day (BID) | ORAL | 0 refills | Status: DC | PRN

## 2019-07-14 NOTE — ED Triage Notes (Signed)
Pt arrived via [facility] with complaints of lower back pain when he stands up. 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 Stating he took two Ibuprofen prior to arrival. No pain when laying down.

## 2019-07-14 NOTE — ED Notes (Signed)
[facility] called for transport.  

## 2019-07-14 NOTE — ED Notes (Signed)
Spoke to Colman at [facility], told to call [facility] for transport home.

## 2019-07-14 NOTE — Discharge Instructions (Addendum)
You were seen today for back pain.  Take Flexeril as needed.  Do not drive while taking Flexeril.

## 2019-07-14 NOTE — ED Provider Notes (Signed)
[facility] DEPT Provider Note   CSN: [id number] Arrival date & time: 07/14/19  0520     History   Chief Complaint Chief Complaint  Patient presents with  . Back Pain    HPI Ian Carter is a 68 y.o. male.     HPI  This is a 68 year old male with a history of hypertension, schizophrenia who presents with back pain.  Patient reports that he has been painting more in the kitchen.  He has had several day history of worsening lower back pain.  It is worse when he stands.  Denies any overt injury.  Denies any weakness, numbness, tingling, bowel or bladder difficulty.  He took 2 ibuprofen with minimal relief.  He rates his pain at 7 out of 10.  No history of IV drug use or cancer.  Past Medical History:  Diagnosis Date  . Anxiety   . Dyslipidemia   . Hypertension   . Schizophrenia ([place])   . Seizures [facility])     Patient Active Problem List   Diagnosis Date Noted  . Chest pain 12/09/2016  . GERD (gastroesophageal reflux disease) 12/09/2016  . CHF (congestive heart failure) ([place]) 12/09/2016  . Acute pulmonary edema ([facility])   . Schizophrenia ([place])   . Empyema lung ([place]) 12/28/2015  . Acute kidney injury ([place])   . Loculated pleural effusion   . Pleural effusion   . CAP (community acquired pneumonia) 12/21/2015  . HTN (hypertension) 12/21/2015  . HLD (hyperlipidemia) 12/21/2015    Past Surgical History:  Procedure Laterality Date  . PLEURAL EFFUSION DRAINAGE Right 12/28/2015   Procedure: DRAINAGE OF RIGHT  PLEURAL EFFUSION;  Surgeon: Melrose Nakayama, MD;  Location: [facility];  Service: Thoracic;  Laterality: Right;  Marland Kitchen VIDEO ASSISTED THORACOSCOPY (VATS)/DECORTICATION Right 12/28/2015   Procedure: RIGHT VIDEO ASSISTED THORACOSCOPY (VATS)/DECORTICATION;  Surgeon: Melrose Nakayama, MD;  Location: [facility];  Service: Thoracic;  Laterality: Right;        Home Medications    Prior to Admission medications   Medication Sig Start Date End Date  Taking? Authorizing Provider  acetaminophen (TYLENOL) 500 MG tablet Take 500 mg by mouth every 8 (eight) hours as needed for mild pain, moderate pain or headache.    [provider]  albuterol (PROVENTIL HFA;VENTOLIN HFA) 108 (90 Base) MCG/ACT inhaler Inhale 1-2 puffs into the lungs every 6 (six) hours as needed for wheezing or shortness of breath.    [provider]  amLODipine (NORVASC) 5 MG tablet Take 5 mg by mouth daily.    [provider]  aspirin 81 MG tablet Take 81 mg by mouth daily.    [provider]  benztropine (COGENTIN) 0.5 MG tablet Take 0.5 mg by mouth 2 (two) times daily.    [provider]  carbamazepine (TEGRETOL) 200 MG tablet Take 200-400 mg by mouth 2 (two) times daily. Take 1 tablet by mouth every morning, and 2 tablets at bedtime    [provider]  cetirizine (ZYRTEC) 10 MG tablet Take 10 mg by mouth at bedtime.    [provider]  cholecalciferol (VITAMIN D) 400 units TABS tablet Take 400 Units by mouth daily.    [provider]  docusate sodium (COLACE) 100 MG capsule Take 100 mg by mouth 2 (two) times daily.    [provider]  haloperidol (HALDOL) 20 MG tablet Take 20 mg by mouth at bedtime.    [provider]  hydrochlorothiazide (MICROZIDE) 12.5 MG capsule Take 12.5  mg by mouth daily.    [provider]  ketoconazole (NIZORAL) 2 % cream Apply 1 application topically daily.    [provider]  pantoprazole (PROTONIX) 40 MG tablet Take 1 tablet (40 mg total) by mouth daily before breakfast. Patient taking differently: Take 20 mg by mouth daily before breakfast.  01/03/16   [name], MD  simvastatin (ZOCOR) 10 MG tablet Take 5 mg by mouth at bedtime.     [provider]  Skin Protectants, Misc. (EUCERIN) cream Apply 1 application topically 2 (two) times daily.    [provider]    Family History Family History  Problem Relation Age of  Onset  . Diabetes Mellitus II Brother     Social History Social History   Tobacco Use  . Smoking status: Heavy Tobacco Smoker    Packs/day: 0.50    Types: Cigars  . Smokeless tobacco: Never Used  Substance Use Topics  . Alcohol use: No  . Drug use: No     Allergies   Patient has no known allergies.   Review of Systems Review of Systems  Constitutional: Negative for fever.  Respiratory: Negative for shortness of breath.   Cardiovascular: Negative for chest pain.  Gastrointestinal: Negative for abdominal pain.  Genitourinary: Negative for dysuria and hematuria.  Musculoskeletal: Positive for back pain.  Neurological: Negative for weakness and numbness.  All other systems reviewed and are negative.    Physical Exam Updated Vital Signs BP 133/88   Pulse 66   Temp 97.9 F (36.6 C) (Oral)   Resp 16   SpO2 97%   Physical Exam Vitals signs and nursing note reviewed.  Constitutional:      Appearance: He is well-developed. He is not ill-appearing.     Comments: Disheveled appearing but nontoxic  HENT:     Head: Normocephalic and atraumatic.  Eyes:     Pupils: Pupils are equal, round, and reactive to light.  Neck:     Musculoskeletal: Neck supple.  Cardiovascular:     Rate and Rhythm: Normal rate and regular rhythm.     Heart sounds: Normal heart sounds.  Pulmonary:     Effort: Pulmonary effort is normal. No respiratory distress.  Abdominal:     Palpations: Abdomen is soft.     Tenderness: There is no abdominal tenderness.  Musculoskeletal:        General: Tenderness present.     Comments: Tenderness palpation left paraspinous muscle region of the lower lumbar spine, no step-off or deformity, negative straight leg raise bilaterally  Skin:    General: Skin is warm and dry.  Neurological:     Mental Status: He is alert and oriented to person, place, and time.     Comments: 5 out of 5 strength bilateral lower extremities, no clonus  Psychiatric:        Mood  and Affect: Mood normal.      ED Treatments / Results  Labs (all labs ordered are listed, but only abnormal results are displayed) Labs Reviewed - No data to display  EKG None  Radiology No results found.  Procedures Procedures (including critical care time)  Medications Ordered in ED Medications  cyclobenzaprine (FLEXERIL) tablet 5 mg (has no administration in time range)  ibuprofen (ADVIL) tablet 400 mg (has no administration in time range)     Initial Impression / Assessment and Plan / ED Course  I have reviewed the triage vital signs and the nursing notes.  Pertinent labs & imaging  results that were available during my care of the patient were reviewed by me and considered in my medical decision making (see chart for details).        Patient presents with back pain.  He is overall nontoxic and vital signs are reassuring.  No red flags with the exception of age.  He has no signs or symptoms of cauda equina.  He reports pain is worse with standing and certain positions.  Exam is reassuring.  Do not feel he needs x-rays as he does not have any significant history of injury.  Patient was given Flexeril and ibuprofen.  Will discharge with Flexeril for presumed musculoskeletal etiology.  After history, exam, and medical workup I feel the patient has been appropriately medically screened and is safe for discharge home. Pertinent diagnoses were discussed with the patient. Patient was given return precautions.  Final Clinical Impressions(s) / ED Diagnoses   Final diagnoses:  Musculoskeletal back pain    ED Discharge Orders    None       Shon BatonHorton,  F, MD 07/14/19 [id number]

## 2020-07-13 ENCOUNTER — Ambulatory Visit: Payer: Medicare Other | Admitting: Podiatry

## 2020-07-19 ENCOUNTER — Ambulatory Visit: Payer: Medicare Other | Admitting: Podiatry

## 2023-07-29 ENCOUNTER — Emergency Department (HOSPITAL_COMMUNITY)
Admission: EM | Admit: 2023-07-29 | Discharge: 2023-07-29 | Disposition: A | Discharge status: Home / Self Care | Payer: Medicare HMO | Attending: Emergency Medicine | Admitting: Emergency Medicine

## 2023-07-29 ENCOUNTER — Emergency Department (HOSPITAL_COMMUNITY): Payer: Medicare HMO

## 2023-07-29 ENCOUNTER — Encounter (HOSPITAL_COMMUNITY): Payer: Self-pay

## 2023-07-29 DIAGNOSIS — Z7982 Long term (current) use of aspirin: Secondary | ICD-10-CM | POA: Diagnosis not present

## 2023-07-29 DIAGNOSIS — R079 Chest pain, unspecified: Secondary | ICD-10-CM | POA: Diagnosis present

## 2023-07-29 DIAGNOSIS — M25512 Pain in left shoulder: Secondary | ICD-10-CM | POA: Insufficient documentation

## 2023-07-29 DIAGNOSIS — R0789 Other chest pain: Secondary | ICD-10-CM | POA: Insufficient documentation

## 2023-07-29 LAB — BASIC METABOLIC PANEL
Anion gap: 8 (ref 5–15)
BUN: 10 mg/dL (ref 8–23)
CO2: 25 mmol/L (ref 22–32)
Calcium: 8.9 mg/dL (ref 8.9–10.3)
Chloride: 104 mmol/L (ref 98–111)
Creatinine, Ser: 1.34 mg/dL — ABNORMAL HIGH (ref 0.61–1.24)
GFR, Estimated: 57 mL/min — ABNORMAL LOW (ref >60)
Glucose, Bld: 87 mg/dL (ref 70–99)
Potassium: 4.1 mmol/L (ref 3.5–5.1)
Sodium: 137 mmol/L (ref 135–145)

## 2023-07-29 LAB — CBC
HCT: 40.3 % (ref 39.0–52.0)
Hemoglobin: 12.8 g/dL — ABNORMAL LOW (ref 13.0–17.0)
MCH: 28.6 pg (ref 26.0–34.0)
MCHC: 31.8 g/dL (ref 30.0–36.0)
MCV: 90.2 fL (ref 80.0–100.0)
Platelets: 233 10*3/uL (ref 150–400)
RBC: 4.47 MIL/uL (ref 4.22–5.81)
RDW: 13.2 % (ref 11.5–15.5)
WBC: 10.4 10*3/uL (ref 4.0–10.5)
nRBC: 0 % (ref 0.0–0.2)

## 2023-07-29 LAB — TROPONIN I (HIGH SENSITIVITY)
Troponin I (High Sensitivity): 4 ng/L (ref <18)
Troponin I (High Sensitivity): 5 ng/L (ref <18)

## 2023-07-29 NOTE — ED Triage Notes (Signed)
Pt BIBGEMS with chest pain in the left side that radiates to his left arm. Pt took one nitroglycerin with EMS with no relief, and 324 ASA. Pt is alert and oriented speaking in full sentences does not appear to be in any distress.   126/80  18 g LAC

## 2023-07-29 NOTE — ED Notes (Signed)
PTAR on unit to transport pt back to [facility].

## 2023-07-29 NOTE — ED Provider Notes (Signed)
[facility] EMERGENCY DEPARTMENT AT [facility] Provider Note   CSN: [id number] Arrival date & time: 07/29/23  7425     History  Chief Complaint  Patient presents with   Chest Pain    Ian Carter is a 72 y.o. male.  72 year old male with prior medical history as detailed below presents for evaluation.  Patient reports that he was in his normal state of health until this morning.  He woke up and then began to experience left-sided chest and shoulder discomfort.  Patient called EMS.  EMS administered 1 sublingual nitroglycerin and 324 mg aspirin.  Patient reports that he took Tylenol prior to EMS arrival.  On my arrival he reports that he feels much improved.  He primarily is experiencing some mild discomfort in the left anterior shoulder at this time.  He denies chest pain.  He denies associated shortness of breath.  He denies nausea, vomiting, other discomfort.  Shoulder pain does appear to be increased with active ranging of his shoulder.  The history is provided by the patient and medical records.       Home Medications Prior to Admission medications   Medication Sig Start Date End Date Taking? Authorizing Provider  acetaminophen (TYLENOL) 500 MG tablet Take 500 mg by mouth every 8 (eight) hours as needed for mild pain, moderate pain or headache.    [provider]  albuterol (PROVENTIL HFA;VENTOLIN HFA) 108 (90 Base) MCG/ACT inhaler Inhale 1-2 puffs into the lungs every 6 (six) hours as needed for wheezing or shortness of breath.    [provider]  amLODipine (NORVASC) 5 MG tablet Take 5 mg by mouth daily.    [provider]  aspirin 81 MG tablet Take 81 mg by mouth daily.    [provider]  benztropine (COGENTIN) 0.5 MG tablet Take 0.5 mg by mouth 2 (two) times daily.    [provider]  carbamazepine (TEGRETOL) 200 MG tablet Take 200-400 mg by mouth 2 (two) times daily. Take 1 tablet by mouth every morning, and 2  tablets at bedtime    [provider]  cetirizine (ZYRTEC) 10 MG tablet Take 10 mg by mouth at bedtime.    [provider]  cholecalciferol (VITAMIN D) 400 units TABS tablet Take 400 Units by mouth daily.    [provider]  cyclobenzaprine (FLEXERIL) 5 MG tablet Take 1 tablet (5 mg total) by mouth 2 (two) times daily as needed for muscle spasms. 07/14/19   Horton, Mayer Masker, MD  docusate sodium (COLACE) 100 MG capsule Take 100 mg by mouth 2 (two) times daily.    [provider]  haloperidol (HALDOL) 20 MG tablet Take 20 mg by mouth at bedtime.    [provider]  hydrochlorothiazide (MICROZIDE) 12.5 MG capsule Take 12.5 mg by mouth daily.    [provider]  ketoconazole (NIZORAL) 2 % cream Apply 1 application topically daily.    [provider]  pantoprazole (PROTONIX) 40 MG tablet Take 1 tablet (40 mg total) by mouth daily before breakfast. Patient taking differently: Take 20 mg by mouth daily before breakfast.  01/03/16   [name], MD  simvastatin (ZOCOR) 10 MG tablet Take 5 mg by mouth at bedtime.     [provider]  Skin Protectants, Misc. (EUCERIN) cream Apply 1 application topically 2 (two) times daily.    [provider]      Allergies    Patient has no known allergies.  Review of Systems   Review of Systems  All other systems reviewed and are negative.   Physical Exam Updated Vital Signs BP 100/70   Pulse 69   Temp 97.7 F (36.5 C) (Oral)   Resp 14   Ht 6\' 2"  (1.88 m)   Wt 81.6 kg   SpO2 100%   BMI 23.11 kg/m  Physical Exam Vitals and nursing note reviewed.  Constitutional:      General: He is not in acute distress.    Appearance: Normal appearance. He is well-developed.  HENT:     Head: Normocephalic and atraumatic.  Eyes:     Conjunctiva/sclera: Conjunctivae normal.     Pupils: Pupils are equal, round, and reactive to light.  Cardiovascular:     Rate and Rhythm: Normal  rate and regular rhythm.     Heart sounds: Normal heart sounds.  Pulmonary:     Effort: Pulmonary effort is normal. No respiratory distress.     Breath sounds: Normal breath sounds.  Abdominal:     General: There is no distension.     Palpations: Abdomen is soft.     Tenderness: There is no abdominal tenderness.  Musculoskeletal:        General: No deformity. Normal range of motion.     Cervical back: Normal range of motion and neck supple.  Skin:    General: Skin is warm and dry.  Neurological:     General: No focal deficit present.     Mental Status: He is alert and oriented to person, place, and time.     ED Results / Procedures / Treatments   Labs (all labs ordered are listed, but only abnormal results are displayed) Labs Reviewed  BASIC METABOLIC PANEL  CBC  TROPONIN I (HIGH SENSITIVITY)    EKG EKG Interpretation Date/Time:  Monday July 29 2023 07:22:06 EDT Ventricular Rate:  72 PR Interval:  205 QRS Duration:  100 QT Interval:  384 QTC Calculation: 421 R Axis:   73  Text Interpretation: Sinus rhythm Consider left atrial enlargement Confirmed by Kristine Royal [phone]) on 07/29/2023 7:28:36 AM  Radiology No results found.  Procedures Procedures    Medications Ordered in ED Medications - No data to display  ED Course/ Medical Decision Making/ A&P                                 Medical Decision Making Amount and/or Complexity of Data Reviewed Labs: ordered. Radiology: ordered.    Medical Screen Complete  This patient presented to the ED with complaint of atypical chest pain, left shoulder pain.  This complaint involves an extensive number of treatment options. The initial differential diagnosis includes, but is not limited to, ACS, muscular strain, metabolic abnormality, etc.  This presentation is: Acute, Self-Limited, Previously Undiagnosed, Uncertain Prognosis, Complicated, Systemic Symptoms, and Threat to Life/Bodily Function  Patient is  presenting with complaint of left shoulder pain and associated left chest pain.  Described symptoms of not typical for ACS.  EKG is without evidence of acute ischemia.  Troponin x 2 is without significant elevation or delta.  Screening imaging including chest x-ray and shoulder x-ray do not reveal acute abnormality. Other screening labs obtained are without significant abnormality.  Patient appears to be much improved after evaluation completion.  He desires discharge.  Importance of close follow-up is stressed.  Strict return precautions given understood.   Additional history obtained: External records from outside  sources obtained and reviewed including prior ED visits and prior Inpatient records.    Lab Tests:  I ordered and personally interpreted labs.  The pertinent results include: CBC, BMP, troponin   Imaging Studies ordered:  I ordered imaging studies including plain films of chest and left shoulder I independently visualized and interpreted obtained imaging which showed NAD I agree with the radiologist interpretation.   Cardiac Monitoring:  The patient was maintained on a cardiac monitor.  I personally viewed and interpreted the cardiac monitor which showed an underlying rhythm of: NSR  Problem List / ED Course:  Atypical chest pain, left shoulder pain   Reevaluation:  After the interventions noted above, I reevaluated the patient and found that they have: improved   Disposition:  After consideration of the diagnostic results and the patients response to treatment, I feel that the patent would benefit from close outpatient follow-up.          Final Clinical Impression(s) / ED Diagnoses Final diagnoses:  Atypical chest pain  Acute pain of left shoulder    Rx / DC Orders ED Discharge Orders     None         Wynetta Fines, MD 07/29/23 [phone]

## 2023-07-29 NOTE — Discharge Instructions (Addendum)
Return for any problem.   As discussed, use Tylenol for pain.  Avoid significant heavy lifting with your left shoulder and arm.  If you develop worsening symptoms or other concern please return to the ER for evaluation.
